# Patient Record
Sex: Female | Born: 1965 | Race: White | Hispanic: No | Smoking: Former smoker
Health system: Southern US, Community
[De-identification: ages and names within clinical notes are randomized; demographics above are authoritative.]

## PROBLEM LIST (undated history)

## (undated) DIAGNOSIS — Z9889 Other specified postprocedural states: Secondary | ICD-10-CM

## (undated) DIAGNOSIS — G43909 Migraine, unspecified, not intractable, without status migrainosus: Secondary | ICD-10-CM

## (undated) DIAGNOSIS — R112 Nausea with vomiting, unspecified: Secondary | ICD-10-CM

## (undated) DIAGNOSIS — F419 Anxiety disorder, unspecified: Secondary | ICD-10-CM

## (undated) HISTORY — PX: ABLATION: SHX5711

## (undated) HISTORY — DX: Migraine, unspecified, not intractable, without status migrainosus: G43.909

---

## 1998-03-20 ENCOUNTER — Other Ambulatory Visit: Admission: RE | Admit: 1998-03-20 | Discharge: 1998-03-20 | Payer: Self-pay | Admitting: *Deleted

## 1998-11-28 ENCOUNTER — Inpatient Hospital Stay (HOSPITAL_COMMUNITY): Admission: AD | Admit: 1998-11-28 | Discharge: 1998-11-28 | Payer: Self-pay | Admitting: Obstetrics and Gynecology

## 1998-12-27 ENCOUNTER — Inpatient Hospital Stay (HOSPITAL_COMMUNITY): Admission: AD | Admit: 1998-12-27 | Discharge: 1998-12-27 | Payer: Self-pay | Admitting: *Deleted

## 1999-01-07 ENCOUNTER — Inpatient Hospital Stay (HOSPITAL_COMMUNITY): Admission: AD | Admit: 1999-01-07 | Discharge: 1999-01-10 | Payer: Self-pay | Admitting: Obstetrics and Gynecology

## 1999-02-19 ENCOUNTER — Other Ambulatory Visit: Admission: RE | Admit: 1999-02-19 | Discharge: 1999-02-19 | Payer: Self-pay | Admitting: *Deleted

## 1999-08-28 ENCOUNTER — Other Ambulatory Visit: Admission: RE | Admit: 1999-08-28 | Discharge: 1999-08-28 | Payer: Self-pay | Admitting: *Deleted

## 1999-10-04 ENCOUNTER — Encounter (INDEPENDENT_AMBULATORY_CARE_PROVIDER_SITE_OTHER): Payer: Self-pay

## 1999-10-04 ENCOUNTER — Other Ambulatory Visit: Admission: RE | Admit: 1999-10-04 | Discharge: 1999-10-04 | Payer: Self-pay | Admitting: *Deleted

## 2000-02-28 ENCOUNTER — Other Ambulatory Visit: Admission: RE | Admit: 2000-02-28 | Discharge: 2000-02-28 | Payer: Self-pay | Admitting: Obstetrics & Gynecology

## 2000-08-24 ENCOUNTER — Inpatient Hospital Stay (HOSPITAL_COMMUNITY): Admission: AD | Admit: 2000-08-24 | Discharge: 2000-08-24 | Payer: Self-pay | Admitting: Obstetrics & Gynecology

## 2000-10-16 ENCOUNTER — Inpatient Hospital Stay (HOSPITAL_COMMUNITY): Admission: AD | Admit: 2000-10-16 | Discharge: 2000-10-19 | Payer: Self-pay | Admitting: Obstetrics & Gynecology

## 2001-07-01 ENCOUNTER — Encounter: Payer: Self-pay | Admitting: Family Medicine

## 2001-07-01 ENCOUNTER — Ambulatory Visit (HOSPITAL_COMMUNITY): Admission: RE | Admit: 2001-07-01 | Discharge: 2001-07-01 | Payer: Self-pay | Admitting: Family Medicine

## 2001-08-13 ENCOUNTER — Other Ambulatory Visit: Admission: RE | Admit: 2001-08-13 | Discharge: 2001-08-13 | Payer: Self-pay | Admitting: Obstetrics & Gynecology

## 2001-09-01 ENCOUNTER — Ambulatory Visit (HOSPITAL_COMMUNITY): Admission: RE | Admit: 2001-09-01 | Discharge: 2001-09-01 | Payer: Self-pay | Admitting: Family Medicine

## 2001-09-01 ENCOUNTER — Emergency Department (HOSPITAL_COMMUNITY): Admission: EM | Admit: 2001-09-01 | Discharge: 2001-09-01 | Payer: Self-pay | Admitting: Emergency Medicine

## 2001-09-01 ENCOUNTER — Encounter: Payer: Self-pay | Admitting: Family Medicine

## 2002-09-18 ENCOUNTER — Emergency Department (HOSPITAL_COMMUNITY): Admission: EM | Admit: 2002-09-18 | Discharge: 2002-09-18 | Payer: Self-pay | Admitting: Emergency Medicine

## 2003-01-05 ENCOUNTER — Other Ambulatory Visit: Admission: RE | Admit: 2003-01-05 | Discharge: 2003-01-05 | Payer: Self-pay | Admitting: Obstetrics & Gynecology

## 2004-02-20 ENCOUNTER — Other Ambulatory Visit: Admission: RE | Admit: 2004-02-20 | Discharge: 2004-02-20 | Payer: Self-pay | Admitting: Obstetrics and Gynecology

## 2005-05-13 ENCOUNTER — Other Ambulatory Visit: Admission: RE | Admit: 2005-05-13 | Discharge: 2005-05-13 | Payer: Self-pay | Admitting: Obstetrics and Gynecology

## 2005-10-27 ENCOUNTER — Ambulatory Visit (HOSPITAL_COMMUNITY): Admission: RE | Admit: 2005-10-27 | Discharge: 2005-10-27 | Payer: Self-pay | Admitting: Family Medicine

## 2009-05-07 ENCOUNTER — Emergency Department (HOSPITAL_COMMUNITY): Admission: EM | Admit: 2009-05-07 | Discharge: 2009-05-07 | Payer: Self-pay | Admitting: Emergency Medicine

## 2009-08-15 ENCOUNTER — Ambulatory Visit (HOSPITAL_COMMUNITY): Admission: RE | Admit: 2009-08-15 | Discharge: 2009-08-15 | Payer: Self-pay | Admitting: Obstetrics and Gynecology

## 2010-09-18 ENCOUNTER — Emergency Department (HOSPITAL_COMMUNITY)
Admission: EM | Admit: 2010-09-18 | Discharge: 2010-09-18 | Payer: Self-pay | Source: Home / Self Care | Admitting: Emergency Medicine

## 2010-09-26 ENCOUNTER — Ambulatory Visit (HOSPITAL_COMMUNITY)
Admission: RE | Admit: 2010-09-26 | Discharge: 2010-09-26 | Payer: Self-pay | Source: Home / Self Care | Attending: Orthopedic Surgery | Admitting: Orthopedic Surgery

## 2010-10-24 ENCOUNTER — Ambulatory Visit (HOSPITAL_COMMUNITY)
Admission: RE | Admit: 2010-10-24 | Discharge: 2010-10-24 | Payer: Self-pay | Source: Home / Self Care | Attending: Psychiatry | Admitting: Psychiatry

## 2010-11-07 ENCOUNTER — Encounter (HOSPITAL_COMMUNITY): Payer: Self-pay | Admitting: Psychiatry

## 2010-12-03 ENCOUNTER — Encounter (HOSPITAL_COMMUNITY): Payer: BC Managed Care – PPO | Admitting: Psychiatry

## 2010-12-09 LAB — URINALYSIS, ROUTINE W REFLEX MICROSCOPIC
Bilirubin Urine: NEGATIVE
Ketones, ur: NEGATIVE mg/dL
Nitrite: NEGATIVE
Urobilinogen, UA: 0.2 mg/dL (ref 0.0–1.0)

## 2010-12-09 LAB — URINE CULTURE: Culture: NO GROWTH

## 2011-01-01 LAB — CBC
MCV: 82.8 fL (ref 78.0–100.0)
Platelets: 298 10*3/uL (ref 150–400)
WBC: 6.7 10*3/uL (ref 4.0–10.5)

## 2011-01-01 LAB — PREGNANCY, URINE: Preg Test, Ur: NEGATIVE

## 2011-02-14 NOTE — Discharge Summary (Signed)
[facility]  Patient:    Sherri Salazar, Sherri Salazar                   MRN: [id number] Adm. Date:  [id number] Disc. Date: [id number] Attending:  Mickle Mallory Dictator:   Leilani Able, P.A.                           Discharge Summary  FINAL DIAGNOSES:              Intrauterine pregnancy at [redacted] weeks gestation, history of previous cesarean section, refuses vaginal birth after cesarean section, desires permanent sterilization.  PROCEDURE:                    Low transverse cesarean section and bilateral tubal ligation using the modified Pomeroy method.  SURGEON:                      Dr. Marcelle Overlie.  COMPLICATIONS:                None.                                This 45 year old G3, P1-0-1-1 presents at [redacted] weeks gestation for repeat cesarean section.  Patient had had a previous cesarean section performed in April 2000 and refuses vaginal birth after cesarean section with this pregnancy.  Patient also desires permanent sterilization.  Patient is taken to the operating room on October 16, 2000 by Dr. Marcelle Overlie where a repeat low transverse cesarean section is performed with the delivery of a 6 pound 10 ounce female infant with Apgars of 9 and 9.  Delivery went without complications.  At this point patient ______ for tubal ligation.  A bilateral tubal ligation using the modified Pomeroy method was performed.  The procedure went without complication.  Patients postoperative course was benign.  She was on some extra iron supplementation for a low hemoglobin and was felt ready for discharge on postoperative day #3. Was sent home on a regular diet, told to decrease activities.  Told to continue her iron.  Was given Darvocet-N 100 #20 one to two q.4h. as needed for pain.  Told to follow-up in the office in four weeks.  LABORATORIES:                 Hemoglobin 9.4, white blood cell count 10.9. DD:  11/11/00 TD:  11/11/00 Job:  [id number]

## 2011-02-14 NOTE — Op Note (Signed)
[facility]  Patient:    Sherri Salazar, Sherri Salazar                   MRN: [id number] Proc. Date: 10/16/00 Adm. Date:  [id number] Attending:  Mickle Mallory                           Operative Report  PREOPERATIVE DIAGNOSES:       1. Intrauterine pregnancy, 39 weeks.                               2. Previous cesarean section, refuses a vaginal                                  birth after cesarean section.                               3. Desires permanent sterilization.  POSTOPERATIVE DIAGNOSES:      1. Intrauterine pregnancy, 39 weeks.                               2. Previous cesarean section, refuses a vaginal                                  birth after cesarean section.                               3. Desires permanent sterilization.  PROCEDURE:                    1. Low transverse cesarean section.                               2. Bilateral tubal ligation, modified Pomeroy                                  method. SURGEON:                      Marcelle Overlie, M.D.  ANESTHESIA:                   Spinal.  ESTIMATED BLOOD LOSS:         500 cc.  FINDINGS:                     A female infant in cephalic presentation with Apgars 9 at one minute, 9 at five minutes, weighing 6 pounds 10 ounces.  COMPLICATIONS:                None.  PATHOLOGY:                    Bilateral fallopian tube segments.  DESCRIPTION OF PROCEDURE:     Patient was taken to the operating room. Her spinal was placed without incident. She was then placed in the dorsal supine position with a leftward tilt. The abdomen was then prepped and draped in the  usual sterile fashion and the Foley catheter was placed in the bladder. Using the scalpel, a low transverse incision was made in the area of the previous incision and the incision was extended down to the fascia using good hemostasis. The fascia was scored in the midline. The incision was extended laterally. A Pfannenstiel  incision was created and the rectus muscles were separated in the midline. The peritoneum was entered sharply and the peritoneal incision was then extended superiorly and then inferiorly. The bladder blade was inserted, the lower uterine segment was identified, the bladder flap was then created sharply and then digitally, and then the bladder blade was readjusted. Using the scalpel, a low transverse incision was made and then extended laterally. The amniotic fluid was noted to be clear upon entry into the uterine cavity. The baby was in cephalic presentation, was delivered without incident, with Apgars 9 at one minute, 9 at five minutes, weighing 6 pounds 10 ounces. The cord was clamped and cut and the baby was handed to the awaiting pediatricians and subsequently taken to the newborn nursery. Cord blood was obtained. The placenta was manually removed and noted to be normal and intact. The uterus was cleared of all debris and clots. The uterine incision was closed in a single layer using 0 chromic in continuous running lock stitch and was inspected and noted to be hemostatic. We then turned our attention to the fallopian tubes and the left fallopian tube was visualized and the fimbriated end was clearly identified, the midportion of the tube was grasped using a Babcock clamp, a 3 cm knuckle of tube was tied off using 0 plain gut suture x 2. The tubal segment was excised using Metzenbaum scissors and sent to pathology. Attention was then turned to the right side where the fallopian tube was visualized and the fimbriated end was clearly identified and the Babcock clamp was grasped into the midportion and a 3 cm knuckle was tied off using 0 plain gut suture x 2 and Metzenbaum scissors were used to excise the tubal segment. This tubal segment was also sent to pathology. The uterine incision was then reinspected and noted to be hemostatic. The peritoneum was closed in a continuous running stitch  using 0 Vicryl and the fascia was closed using 0 Vicryl in continuous running lock stitch x 2 starting at each corner and meeting in the midline. After inspection of the subcutaneous layer and noting hemostasis, the skin was closed with staples. All sponge, lap, and instrument counts were correct x 2. Patient tolerated the procedure well and went to recovery room in stable condition. DD:  10/16/00 TD:  10/17/00 Job: [id number]

## 2011-07-25 ENCOUNTER — Other Ambulatory Visit: Payer: Self-pay | Admitting: Obstetrics and Gynecology

## 2012-10-20 ENCOUNTER — Other Ambulatory Visit: Payer: Self-pay | Admitting: Obstetrics and Gynecology

## 2013-09-08 ENCOUNTER — Other Ambulatory Visit: Payer: Self-pay | Admitting: Adult Health

## 2013-09-08 DIAGNOSIS — Z139 Encounter for screening, unspecified: Secondary | ICD-10-CM

## 2013-09-20 ENCOUNTER — Encounter (INDEPENDENT_AMBULATORY_CARE_PROVIDER_SITE_OTHER): Payer: BC Managed Care – PPO | Admitting: Adult Health

## 2013-09-20 ENCOUNTER — Ambulatory Visit (HOSPITAL_COMMUNITY)
Admission: RE | Admit: 2013-09-20 | Discharge: 2013-09-20 | Disposition: A | Discharge status: Home / Self Care | Payer: BC Managed Care – PPO | Source: Ambulatory Visit | Attending: Adult Health | Admitting: Adult Health

## 2013-09-20 DIAGNOSIS — Z1231 Encounter for screening mammogram for malignant neoplasm of breast: Secondary | ICD-10-CM | POA: Insufficient documentation

## 2013-09-20 DIAGNOSIS — Z139 Encounter for screening, unspecified: Secondary | ICD-10-CM

## 2013-09-20 NOTE — Progress Notes (Signed)
This encounter was created in error - please disregard.

## 2013-10-25 ENCOUNTER — Other Ambulatory Visit: Payer: BC Managed Care – PPO | Admitting: Adult Health

## 2013-11-14 ENCOUNTER — Other Ambulatory Visit: Payer: BC Managed Care – PPO | Admitting: Adult Health

## 2013-11-24 ENCOUNTER — Other Ambulatory Visit: Payer: BC Managed Care – PPO | Admitting: Adult Health

## 2013-12-15 ENCOUNTER — Encounter (INDEPENDENT_AMBULATORY_CARE_PROVIDER_SITE_OTHER): Payer: Self-pay

## 2013-12-15 ENCOUNTER — Other Ambulatory Visit (HOSPITAL_COMMUNITY)
Admission: RE | Admit: 2013-12-15 | Discharge: 2013-12-15 | Disposition: A | Discharge status: Home / Self Care | Payer: BC Managed Care – PPO | Source: Ambulatory Visit | Attending: Adult Health | Admitting: Adult Health

## 2013-12-15 ENCOUNTER — Encounter: Payer: Self-pay | Admitting: Adult Health

## 2013-12-15 ENCOUNTER — Ambulatory Visit (INDEPENDENT_AMBULATORY_CARE_PROVIDER_SITE_OTHER): Payer: BC Managed Care – PPO | Admitting: Adult Health

## 2013-12-15 VITALS — BP 108/62 | HR 70 | Ht 63.0 in | Wt 147.0 lb

## 2013-12-15 DIAGNOSIS — Z01419 Encounter for gynecological examination (general) (routine) without abnormal findings: Secondary | ICD-10-CM | POA: Insufficient documentation

## 2013-12-15 DIAGNOSIS — Z1212 Encounter for screening for malignant neoplasm of rectum: Secondary | ICD-10-CM

## 2013-12-15 DIAGNOSIS — G43909 Migraine, unspecified, not intractable, without status migrainosus: Secondary | ICD-10-CM | POA: Insufficient documentation

## 2013-12-15 DIAGNOSIS — Z1151 Encounter for screening for human papillomavirus (HPV): Secondary | ICD-10-CM | POA: Insufficient documentation

## 2013-12-15 LAB — HEMOCCULT GUIAC POC 1CARD (OFFICE): Fecal Occult Blood, POC: NEGATIVE

## 2013-12-15 MED ORDER — ESTRADIOL 0.1 MG/24HR TD PTTW: MEDICATED_PATCH | TRANSDERMAL | Status: DC

## 2013-12-15 NOTE — Progress Notes (Signed)
Patient ID: Sherri Salazar, female   DOB: 03/12/1966, 48 y.o.   MRN: [id number] History of Present Illness: Sherri Salazar is a 48 year old white female, married in for a pap and physical and complains of headaches every month, tried Topamax, felt bad and stopped.Has had ablation but spots some.Headache calendar show pattern that looks like menstrual headaches.Has some nausea.Says Excedrin migraine helps.Was seen at [facility] in past.   Current Medications, Allergies, Past Medical History, Past Surgical History, Family History and Social History were reviewed in [employer] record.     Review of Systems: Patient denies any blurred vision, shortness of breath, chest pain, abdominal pain, problems with bowel movements, urination, or intercourse. No joint pain or mood swings. Had 2 C-sections.   Physical Exam:BP 108/62  Pulse 70  Ht 5\' 3"  (1.6 m)  Wt 147 lb (66.679 kg)  BMI 26.05 kg/m2 General:  Well developed, well nourished, no acute distress Skin:  Warm and dry Neck:  Midline trachea, normal thyroid Lungs; Clear to auscultation bilaterally Breast:  No dominant palpable mass, retraction, or nipple discharge Cardiovascular: Regular rate and rhythm Abdomen:  Soft, non tender, no hepatosplenomegaly Pelvic:  External genitalia is normal in appearance.  The vagina is normal in appearance. The cervix is smooth. Pap with HPV. Uterus is felt to be normal size, shape, and contour.  No  adnexal masses or tenderness noted. Rectal: Good sphincter tone, no polyps, or hemorrhoids felt.  Hemoccult negative. Extremities:  No swelling noted Psych:  No mood changes, alert and cooperative,seems happy teaches first grade Discussed with Dr Elonda Husky.  Impression: Yearly gyn exam Migraines ? menstrual     Plan: Try minivelle patch 0.1 mg 1 patch before headache starts 4 patches give, lot [id number] Exp 4/16,  Return in 4 weeks Physical in 1 year Mammogram yearly   Colonoscopy at 38

## 2013-12-15 NOTE — Patient Instructions (Signed)
Recurrent Migraine Headache A migraine headache is an intense, throbbing pain on one or both sides of your head. Recurrent migraines keep coming back. A migraine can last for 30 minutes to several hours. CAUSES  The exact cause of a migraine headache is not always known. However, a migraine may be caused when nerves in the brain become irritated and release chemicals that cause inflammation. This causes pain. Certain things may also trigger migraines, such as:   Alcohol.  Smoking.  Stress.  Menstruation.  Aged cheeses.  Foods or drinks that contain nitrates, glutamate, aspartame, or tyramine.  Lack of sleep.  Chocolate.  Caffeine.  Hunger.  Physical exertion.  Fatigue.  Medicines used to treat chest pain (nitroglycerine), birth control pills, estrogen, and some blood pressure medicines. SYMPTOMS   Pain on one or both sides of your head.  Pulsating or throbbing pain.  Severe pain that prevents daily activities.  Pain that is aggravated by any physical activity.  Nausea, vomiting, or both.  Dizziness.  Pain with exposure to bright lights, loud noises, or activity.  General sensitivity to bright lights, loud noises, or smells. Before you get a migraine, you may get warning signs that a migraine is coming (aura). An aura may include:  Seeing flashing lights.  Seeing bright spots, halos, or zig-zag lines.  Having tunnel vision or blurred vision.  Having feelings of numbness or tingling.  Having trouble talking.  Having muscle weakness. DIAGNOSIS  A recurrent migraine headache is often diagnosed based on:  Symptoms.  Physical examination.  A CT scan or MRI of your head. These imaging tests cannot diagnose migraines, but can help rule out other causes of headaches.  TREATMENT  Medicines may be given for pain and nausea. Medicines can also be given to help prevent recurrent migraines. HOME CARE INSTRUCTIONS  Only take over-the-counter or prescription  medicines for pain or discomfort as directed by your health care provider. The use of long-term narcotics is not recommended.  Lie down in a dark, quiet room when you have a migraine.  Keep a journal to find out what may trigger your migraine headaches. For example, write down:  What you eat and drink.  How much sleep you get.  Any change to your diet or medicines.  Limit alcohol consumption.  Quit smoking if you smoke.  Get 7 9 hours of sleep, or as recommended by your health care provider.  Limit stress.  Keep lights dim if bright lights bother you and make your migraines worse. SEEK MEDICAL CARE IF:   You do not get relief from the medicines given to you.  You have a recurrence of pain. SEEK IMMEDIATE MEDICAL CARE IF:  Your migraine becomes severe.  You have a fever.  You have a stiff neck.  You have loss of vision.  You have muscular weakness or loss of muscle control.  You start losing your balance or have trouble walking.  You feel faint or pass out.  You have severe symptoms that are different from your first symptoms. MAKE SURE YOU:   Understand these instructions.  Will watch your condition.  Will get help right away if you are not doing well or get worse. Document Released: 06/10/2001 Document Revised: 07/06/2013 Document Reviewed: 05/23/2013 [facility] Patient Information 2014 [place]. TRY Patch Follow up in 4 weeks Physical in 1 year Mammogram yearly  Colonoscopy at 89

## 2014-01-12 ENCOUNTER — Encounter: Payer: Self-pay | Admitting: Adult Health

## 2014-01-12 ENCOUNTER — Ambulatory Visit (INDEPENDENT_AMBULATORY_CARE_PROVIDER_SITE_OTHER): Payer: BC Managed Care – PPO | Admitting: Adult Health

## 2014-01-12 VITALS — BP 110/74 | Ht 63.0 in | Wt 149.0 lb

## 2014-01-12 DIAGNOSIS — G43909 Migraine, unspecified, not intractable, without status migrainosus: Secondary | ICD-10-CM

## 2014-01-12 MED ORDER — ESTRADIOL 0.1 MG/24HR TD PTTW: MEDICATED_PATCH | TRANSDERMAL | Status: DC

## 2014-01-12 MED ORDER — BUTALBITAL-APAP-CAFFEINE 50-325-40 MG PO TABS: 1.0000 | ORAL_TABLET | Freq: Four times a day (QID) | ORAL | Status: DC | PRN

## 2014-01-12 NOTE — Progress Notes (Signed)
Subjective:     Patient ID: Sherri Salazar, female   DOB: December 12, 1965, 48 y.o.   MRN: [id number]  HPI Sherri Salazar is a 35 yea old white female back to discuss migraines, may be a bit better, had one 3/23 and 4/10 and used patch, helped some but still lasted 10-12 hours.Has 1 today but has been stressed at school and is even teary today.and teen age daughter is being difficult.  Review of Systems See HPI Reviewed past medical,surgical, social and family history. Reviewed medications and allergies.     Objective:   Physical Exam BP 110/74  Ht 5\' 3"  (1.6 m)  Wt 149 lb (67.586 kg)  BMI 26.40 kg/m2   talked about taking time for self, continue patch and lets try Fioricet, also mentioned counseling. She says can't do now due to work.  Assessment:     Migraines     Plan:     Rx fioricet #30 1 every 6 hours prn headache with 1 refill Continue trying patch minivelle 0.1 mg [id number] exp 4/16 Call in follow up

## 2014-01-12 NOTE — Patient Instructions (Signed)
Call me in follow up

## 2014-02-23 ENCOUNTER — Telehealth: Payer: Self-pay | Admitting: Adult Health

## 2014-02-23 MED ORDER — ESTRADIOL 0.1 MG/24HR TD PTTW: MEDICATED_PATCH | TRANSDERMAL | Status: DC

## 2014-02-23 NOTE — Telephone Encounter (Signed)
Pt states MInivelle patch has helped with the migraines, requesting a prescription to be sent to pharmacy.

## 2014-02-23 NOTE — Telephone Encounter (Signed)
Pt says patch has helped headaches an she wants them called in to [facility]

## 2014-07-31 ENCOUNTER — Encounter: Payer: Self-pay | Admitting: Adult Health

## 2015-10-12 ENCOUNTER — Other Ambulatory Visit: Payer: Self-pay

## 2015-10-12 ENCOUNTER — Encounter: Payer: Self-pay | Admitting: Vascular Surgery

## 2015-10-12 DIAGNOSIS — I8390 Asymptomatic varicose veins of unspecified lower extremity: Secondary | ICD-10-CM

## 2015-10-15 ENCOUNTER — Encounter: Payer: Self-pay | Admitting: Vascular Surgery

## 2015-10-15 ENCOUNTER — Ambulatory Visit (HOSPITAL_COMMUNITY)
Admission: RE | Admit: 2015-10-15 | Discharge: 2015-10-15 | Disposition: A | Discharge status: Home / Self Care | Payer: BLUE CROSS/BLUE SHIELD | Source: Ambulatory Visit | Attending: Vascular Surgery | Admitting: Vascular Surgery

## 2015-10-15 ENCOUNTER — Ambulatory Visit (INDEPENDENT_AMBULATORY_CARE_PROVIDER_SITE_OTHER): Payer: BLUE CROSS/BLUE SHIELD | Admitting: Vascular Surgery

## 2015-10-15 VITALS — BP 116/79 | HR 62 | Temp 98.0°F | Resp 16 | Ht 63.0 in | Wt 138.0 lb

## 2015-10-15 DIAGNOSIS — I868 Varicose veins of other specified sites: Secondary | ICD-10-CM

## 2015-10-15 DIAGNOSIS — I83891 Varicose veins of right lower extremities with other complications: Secondary | ICD-10-CM | POA: Insufficient documentation

## 2015-10-15 DIAGNOSIS — I8390 Asymptomatic varicose veins of unspecified lower extremity: Secondary | ICD-10-CM

## 2015-10-15 DIAGNOSIS — M79604 Pain in right leg: Secondary | ICD-10-CM | POA: Diagnosis not present

## 2015-10-15 DIAGNOSIS — I8391 Asymptomatic varicose veins of right lower extremity: Secondary | ICD-10-CM | POA: Insufficient documentation

## 2015-10-15 NOTE — Progress Notes (Signed)
Subjective:     Patient ID: Sherri Salazar, female   DOB: [birth year], 50 y.o.   MRN: [id number]  HPI this 50 year old female is evaluated today for painful varicosities in the right leg. She is self-referred. She has had painful discomfort and a small varicosity in the right lower leg laterally just below the knee. This began after her first pregnancy. She also had a prominent vein extending upper thigh into the right inguinal area which has become less prominent since the pregnancy. She has no history of DVT or thrombophlebitis stasis ulcers or bleeding. She does not last compression stockings. She is on her feet most of the day.  Past Medical History  Diagnosis Date  . Migraines     Social History  Substance Use Topics  . Smoking status: Former Smoker    Types: Cigarettes  . Smokeless tobacco: Never Used  . Alcohol Use: No    Family History  Problem Relation Age of Onset  . Parkinson's disease Father     Allergies  Allergen Reactions  . Codeine Other (See Comments)    hallucinate     Current outpatient prescriptions:  .  Aspirin-Acetaminophen-Caffeine (EXCEDRIN MIGRAINE PO), Take by mouth as needed. Reported on 10/15/2015, Disp: , Rfl:  .  butalbital-acetaminophen-caffeine (FIORICET, ESGIC) 50-325-40 MG per tablet, Take 1 tablet by mouth every 6 (six) hours as needed for headache. (Patient not taking: Reported on 10/15/2015), Disp: 30 tablet, Rfl: 1 .  estradiol (MINIVELLE) 0.1 MG/24HR patch, Use as directed (Patient not taking: Reported on 10/15/2015), Disp: 8 patch, Rfl: 0 .  estradiol (MINIVELLE) 0.1 MG/24HR patch, Use 1 patch before period for migraine prevention (Patient not taking: Reported on 10/15/2015), Disp: 8 patch, Rfl: 1  Filed Vitals:   10/15/15 1017  BP: 116/79  Pulse: 62  Temp: 98 F (36.7 C)  Resp: 16  Height: 5\' 3"  (1.6 m)  Weight: 138 lb (62.596 kg)  SpO2: 98%    Body mass index is 24.45 kg/(m^2).           Review of Systems denies chest  pain, dyspnea on exertion, PND, orthopnea. Negative review of systems is healthy lady     Objective:   Physical Exam BP 116/79 mmHg  Pulse 62  Temp(Src) 98 F (36.7 C)  Resp 16  Ht 5\' 3"  (1.6 m)  Wt 138 lb (62.596 kg)  BMI 24.45 kg/m2  SpO2 98%  Gen.-alert and oriented x3 in no apparent distress HEENT normal for age Lungs no rhonchi or wheezing Cardiovascular regular rhythm no murmurs carotid pulses 3+ palpable no bruits audible Abdomen soft nontender no palpable masses Musculoskeletal free of  major deformities Skin clear -no rashes Neurologic normal Lower extremities 3+ femoral and dorsalis pedis pulses palpable bilaterally with no edema Right leg with 2 prominent reticular veins in the proximal third laterally below the knee. Also linear collection of reticular veins in the anterior thigh extending up to near inguinal area. Noted edema distally. No ulceration noted. No hyperpigmentation noted.  Today I ordered a venous duplex exam the right leg which I reviewed and interpreted. There is no DVT. There is no reflux in the great saphenous vein other than below the knee where there are a few perforating branches which are not competent. Small saphenous is competent.      Assessment:     Painful reticular and spider veins right anterior thigh and lateral calf with a few small incompetent perforating branches below the knee but no gross refluxing great saphenous  system    Plan:     Discussed foam sclerotherapy for these areas and patient will consider this

## 2016-04-25 ENCOUNTER — Other Ambulatory Visit: Payer: Self-pay | Admitting: Adult Health

## 2016-04-25 DIAGNOSIS — Z1231 Encounter for screening mammogram for malignant neoplasm of breast: Secondary | ICD-10-CM

## 2016-05-07 ENCOUNTER — Other Ambulatory Visit: Payer: Self-pay | Admitting: Adult Health

## 2016-05-09 ENCOUNTER — Ambulatory Visit (HOSPITAL_COMMUNITY)
Admission: RE | Admit: 2016-05-09 | Discharge: 2016-05-09 | Disposition: A | Discharge status: Home / Self Care | Payer: BLUE CROSS/BLUE SHIELD | Source: Ambulatory Visit | Attending: Adult Health | Admitting: Adult Health

## 2016-05-09 DIAGNOSIS — Z1231 Encounter for screening mammogram for malignant neoplasm of breast: Secondary | ICD-10-CM

## 2016-05-13 ENCOUNTER — Other Ambulatory Visit: Payer: Self-pay | Admitting: Adult Health

## 2016-07-10 ENCOUNTER — Encounter: Payer: Self-pay | Admitting: *Deleted

## 2016-07-16 ENCOUNTER — Ambulatory Visit: Payer: BLUE CROSS/BLUE SHIELD | Admitting: *Deleted

## 2016-07-16 ENCOUNTER — Ambulatory Visit (INDEPENDENT_AMBULATORY_CARE_PROVIDER_SITE_OTHER): Payer: BLUE CROSS/BLUE SHIELD | Admitting: *Deleted

## 2016-07-16 DIAGNOSIS — I8393 Asymptomatic varicose veins of bilateral lower extremities: Secondary | ICD-10-CM

## 2016-07-16 DIAGNOSIS — I83891 Varicose veins of right lower extremities with other complications: Secondary | ICD-10-CM

## 2016-07-16 NOTE — Progress Notes (Signed)
X=.3% Sotradecol administered with a 27g butterfly.  Patient received a total of 12cc.  Combo of retics and spiders. Easy access. Tol well. Anticipate good results for this nice lady. Follow prn.    Compression stockings applied: Yes.  and a 4" ace oer retics.

## 2017-06-20 ENCOUNTER — Other Ambulatory Visit (HOSPITAL_COMMUNITY): Payer: Self-pay | Admitting: Physician Assistant

## 2017-06-20 ENCOUNTER — Ambulatory Visit (HOSPITAL_COMMUNITY)
Admission: RE | Admit: 2017-06-20 | Discharge: 2017-06-20 | Disposition: A | Discharge status: Home / Self Care | Payer: BLUE CROSS/BLUE SHIELD | Source: Ambulatory Visit | Attending: Physician Assistant | Admitting: Physician Assistant

## 2017-06-20 DIAGNOSIS — R059 Cough, unspecified: Secondary | ICD-10-CM

## 2017-06-20 DIAGNOSIS — R0602 Shortness of breath: Secondary | ICD-10-CM

## 2017-06-20 DIAGNOSIS — R05 Cough: Secondary | ICD-10-CM

## 2017-12-17 ENCOUNTER — Other Ambulatory Visit: Payer: Self-pay | Admitting: Adult Health

## 2017-12-17 DIAGNOSIS — Z1231 Encounter for screening mammogram for malignant neoplasm of breast: Secondary | ICD-10-CM

## 2017-12-21 ENCOUNTER — Encounter (HOSPITAL_COMMUNITY): Payer: Self-pay

## 2017-12-21 ENCOUNTER — Ambulatory Visit (HOSPITAL_COMMUNITY)
Admission: RE | Admit: 2017-12-21 | Discharge: 2017-12-21 | Disposition: A | Discharge status: Home / Self Care | Payer: BLUE CROSS/BLUE SHIELD | Source: Ambulatory Visit | Attending: Adult Health | Admitting: Adult Health

## 2017-12-21 DIAGNOSIS — Z1231 Encounter for screening mammogram for malignant neoplasm of breast: Secondary | ICD-10-CM | POA: Insufficient documentation

## 2019-02-17 ENCOUNTER — Other Ambulatory Visit (HOSPITAL_COMMUNITY): Payer: Self-pay | Admitting: Adult Health

## 2019-02-17 DIAGNOSIS — Z1231 Encounter for screening mammogram for malignant neoplasm of breast: Secondary | ICD-10-CM

## 2019-02-23 ENCOUNTER — Encounter: Payer: Self-pay | Admitting: Emergency Medicine

## 2019-02-23 ENCOUNTER — Ambulatory Visit
Admission: EM | Admit: 2019-02-23 | Discharge: 2019-02-23 | Disposition: A | Discharge status: Home / Self Care | Payer: BLUE CROSS/BLUE SHIELD

## 2019-02-23 ENCOUNTER — Other Ambulatory Visit: Payer: Self-pay

## 2019-02-23 DIAGNOSIS — R1906 Epigastric swelling, mass or lump: Secondary | ICD-10-CM

## 2019-02-23 NOTE — ED Provider Notes (Addendum)
[facility]   [id number] 02/23/19 Arrival Time: 3151  CC: Abdominal mass  SUBJECTIVE:  Sherri Salazar is a 53 y.o. female no significant past medical hx, who presents with abdominal mass x 1 month.  Came in today because friend encouraged her to be examined.  Denies precipitating event or trauma.  Does admit to recent intentional weight loss secondary to weight-watchers. Localizes the mass to the epigastric area.  Describes as soft and moveable.  Denies pain.  Does not have PCP.  Denies fever, chills, night sweats, nausea, vomiting, SOB, chest pain, abdominal pain, changes in bowel or bladder function.    Denies family hx of cancer.    Reports mammogram in the past and normal  ROS: As per HPI.  Past Medical History:  Diagnosis Date  . Migraines    Past Surgical History:  Procedure Laterality Date  . ABLATION    . CESAREAN SECTION  [id number]   Allergies  Allergen Reactions  . Codeine Other (See Comments)    hallucinate   No current facility-administered medications on file prior to encounter.    Current Outpatient Medications on File Prior to Encounter  Medication Sig Dispense Refill  . Aspirin-Acetaminophen-Caffeine (EXCEDRIN MIGRAINE PO) Take by mouth as needed. Reported on 10/15/2015     Social History   Socioeconomic History  . Marital status: Married    Spouse name: Not on file  . Number of children: Not on file  . Years of education: Not on file  . Highest education level: Not on file  Occupational History  . Not on file  Social Needs  . Financial resource strain: Not on file  . Food insecurity:    Worry: Not on file    Inability: Not on file  . Transportation needs:    Medical: Not on file    Non-medical: Not on file  Tobacco Use  . Smoking status: Former Smoker    Types: Cigarettes  . Smokeless tobacco: Never Used  Substance and Sexual Activity  . Alcohol use: No  . Drug use: No  . Sexual activity: Yes    Birth control/protection:  Surgical  Lifestyle  . Physical activity:    Days per week: Not on file    Minutes per session: Not on file  . Stress: Not on file  Relationships  . Social connections:    Talks on phone: Not on file    Gets together: Not on file    Attends religious service: Not on file    Active member of club or organization: Not on file    Attends meetings of clubs or organizations: Not on file    Relationship status: Not on file  . Intimate partner violence:    Fear of current or ex partner: Not on file    Emotionally abused: Not on file    Physically abused: Not on file    Forced sexual activity: Not on file  Other Topics Concern  . Not on file  Social History Narrative  . Not on file   Family History  Problem Relation Age of Onset  . Parkinson's disease Father     OBJECTIVE: Vitals:   02/23/19 1216  BP: 117/74  Pulse: 75  Resp: 18  Temp: 97.8 F (36.6 C)  TempSrc: Oral  SpO2: 98%    General appearance: alert; healthy appearing Head: NCAT Lungs: clear to auscultation bilaterally Heart: regular rate and rhythm.  Radial pulse 2+ bilaterally Abdomen: soft, nondistended, normal active bowel sounds; nontender to  palpation; 2- 3 cm soft, freely moveable abdominal mass to epigastric region, NTTP, more prominent with abdominal muscle contraction; no guarding  Extremities: no edema Skin: warm and dry Psychological: alert and cooperative; anxious mood and affect  ASSESSMENT & PLAN:  1. Epigastric mass   DDx includes: hernia vs. Lipoma vs. Diastasis recti vs. Malignant mass  Recommending further evaluation and management of abdominal mass with PCP and/or general surgeon Please return or go to the ED if you experience pain, fever, chills, nausea, vomiting, night sweats, changes in bowel or bladder habits, etc...  Reviewed expectations re: course of current medical issues. Questions answered. Outlined signs and symptoms indicating need for more acute intervention. Patient  verbalized understanding. After Visit Summary given.      Lestine Box, PA-C 02/23/19 1244

## 2019-02-23 NOTE — ED Triage Notes (Signed)
Pt here for mass in epigastric area x 1 month; denies pain

## 2019-02-23 NOTE — Discharge Instructions (Signed)
Recommending further evaluation and management of abdominal mass with PCP and/or general surgeon Please return or go to the ED if you experience pain, fever, chills, nausea, vomiting, night sweats, changes in bowel or bladder habits, etc..Marland Kitchen

## 2019-03-14 ENCOUNTER — Other Ambulatory Visit: Payer: Self-pay

## 2019-03-14 ENCOUNTER — Ambulatory Visit (HOSPITAL_COMMUNITY)
Admission: RE | Admit: 2019-03-14 | Discharge: 2019-03-14 | Disposition: A | Discharge status: Home / Self Care | Payer: BC Managed Care – PPO | Source: Ambulatory Visit | Attending: Adult Health | Admitting: Adult Health

## 2019-03-14 DIAGNOSIS — Z1231 Encounter for screening mammogram for malignant neoplasm of breast: Secondary | ICD-10-CM | POA: Insufficient documentation

## 2019-09-28 ENCOUNTER — Ambulatory Visit: Payer: BC Managed Care – PPO | Attending: Internal Medicine

## 2019-09-28 ENCOUNTER — Other Ambulatory Visit: Payer: Self-pay

## 2019-09-28 DIAGNOSIS — Z20822 Contact with and (suspected) exposure to covid-19: Secondary | ICD-10-CM

## 2019-09-29 LAB — NOVEL CORONAVIRUS, NAA: SARS-CoV-2, NAA: NOT DETECTED

## 2019-11-09 ENCOUNTER — Encounter: Payer: Self-pay | Admitting: Adult Health

## 2019-11-09 ENCOUNTER — Other Ambulatory Visit: Payer: Self-pay

## 2019-11-09 ENCOUNTER — Ambulatory Visit (INDEPENDENT_AMBULATORY_CARE_PROVIDER_SITE_OTHER): Payer: BC Managed Care – PPO | Admitting: Adult Health

## 2019-11-09 ENCOUNTER — Other Ambulatory Visit (HOSPITAL_COMMUNITY)
Admission: RE | Admit: 2019-11-09 | Discharge: 2019-11-09 | Disposition: A | Discharge status: Home / Self Care | Payer: BC Managed Care – PPO | Source: Ambulatory Visit | Attending: Adult Health | Admitting: Adult Health

## 2019-11-09 VITALS — BP 115/66 | HR 80 | Ht 62.25 in | Wt 142.5 lb

## 2019-11-09 DIAGNOSIS — F32A Depression, unspecified: Secondary | ICD-10-CM | POA: Insufficient documentation

## 2019-11-09 DIAGNOSIS — Z01419 Encounter for gynecological examination (general) (routine) without abnormal findings: Secondary | ICD-10-CM | POA: Insufficient documentation

## 2019-11-09 DIAGNOSIS — Z1211 Encounter for screening for malignant neoplasm of colon: Secondary | ICD-10-CM | POA: Insufficient documentation

## 2019-11-09 DIAGNOSIS — K439 Ventral hernia without obstruction or gangrene: Secondary | ICD-10-CM

## 2019-11-09 DIAGNOSIS — Z1212 Encounter for screening for malignant neoplasm of rectum: Secondary | ICD-10-CM | POA: Diagnosis not present

## 2019-11-09 DIAGNOSIS — F329 Major depressive disorder, single episode, unspecified: Secondary | ICD-10-CM | POA: Diagnosis not present

## 2019-11-09 LAB — HEMOCCULT GUIAC POC 1CARD (OFFICE): Fecal Occult Blood, POC: NEGATIVE

## 2019-11-09 MED ORDER — PAROXETINE HCL 10 MG PO TABS: 10.0000 mg | ORAL_TABLET | Freq: Every day | ORAL | 2 refills | Status: DC

## 2019-11-09 NOTE — Progress Notes (Signed)
Patient ID: Sherri Salazar, female   DOB: [birth year], 54 y.o.   MRN: [id number] History of Present Illness: Star is a 54 year old white female,separated, G2P 2 in for a well woman gyn exam and pap.Last pap was 12/15/13 and normal with negative HPV.   Current Medications, Allergies, Past Medical History, Past Surgical History, Family History and Social History were reviewed in [employer] record.     Review of Systems: Patient denies any headaches, hearing loss, fatigue, blurred vision, shortness of breath, chest pain, abdominal pain, problems with bowel movements, urination, or intercourse(no sex since 2012). No joint pain, Denies any hot flashes or night sweats, is teary and less focused and feels worthless at times esp since October when she separated. She did [employer] about 2 years ago and lost weight and now has bulge in abdomen,she says if she strains it really bulges out more.   Physical Exam:BP 115/66 (BP Location: Left Arm, Patient Position: Sitting, Cuff Size: Normal)   Pulse 80   Ht 5' 2.25" (1.581 m)   Wt 142 lb 8 oz (64.6 kg)   BMI 25.85 kg/m  General:  Well developed, well nourished, no acute distress Skin:  Warm and dry Neck:  Midline trachea, normal thyroid, good ROM, no lymphadenopathy Lungs; Clear to auscultation bilaterally Breast:  No dominant palpable mass, retraction, or nipple discharge Cardiovascular: Regular rate and rhythm Abdomen:  Soft, non tender, no hepatosplenomegaly, has golf ball sized ?hernia mid epigastric area, on tender and reducible  Pelvic:  External genitalia is normal in appearance, no lesions.  The vagina is pale with loss of moisture and rugae. Urethra has no lesions or masses. The cervix is smooth ,pap with high risk HPV performed.  Uterus is felt to be normal size, shape, and contour.  No adnexal masses or tenderness noted.Bladder is non tender, no masses felt. Rectal: Good sphincter tone, no polyps, or hemorrhoids felt.   Hemoccult negative. Extremities/musculoskeletal:  No swelling or varicosities noted, no clubbing or cyanosis Psych:  No mood changes, alert and cooperative,seems happy Fall risk is low PHQ 9 score is 10, denies being suicidal, and she is open to meds Examination chaperoned by Levy Pupa LPN  Impression and Plan: 1. Encounter for gynecological examination with Papanicolaou smear of cervix Pap sent Physical in 1 year Pap in 3 if normal Check CBC,CMP,TSH and lipids, orders given to get fasting  Mammogram yearly  2. Screening for colorectal cancer Consider cologuard or colonoscopy, will let me know  3. Depression, unspecified depression type Will rx paxil Meds ordered this encounter  Medications  . PARoxetine (PAXIL) 10 MG tablet    Sig: Take 1 tablet (10 mg total) by mouth daily.    Dispense:  30 tablet    Refill:  2    Order Specific Question:   Supervising Provider    Answer:   Tania Ade H [[id number]]  Follow up in 6 weeks   4. Ventral hernia without obstruction or gangrene  Referred to Dr Constance Haw to evaluate  If any pain and can not reduce, go to ER

## 2019-11-11 LAB — CYTOLOGY - PAP
Comment: NEGATIVE
Diagnosis: NEGATIVE
High risk HPV: NEGATIVE

## 2019-11-17 LAB — COMPREHENSIVE METABOLIC PANEL
ALT: 8 IU/L (ref 0–32)
AST: 20 IU/L (ref 0–40)
Albumin/Globulin Ratio: 1.7 (ref 1.2–2.2)
Albumin: 4.3 g/dL (ref 3.8–4.9)
Alkaline Phosphatase: 48 IU/L (ref 39–117)
BUN/Creatinine Ratio: 23 (ref 9–23)
BUN: 23 mg/dL (ref 6–24)
Bilirubin Total: <0.2 mg/dL (ref 0.0–1.2)
CO2: 24 mmol/L (ref 20–29)
Calcium: 9.3 mg/dL (ref 8.7–10.2)
Chloride: 104 mmol/L (ref 96–106)
Creatinine, Ser: 0.98 mg/dL (ref 0.57–1.00)
GFR calc Af Amer: 77 mL/min/{1.73_m2} (ref >59)
GFR calc non Af Amer: 67 mL/min/{1.73_m2} (ref >59)
Globulin, Total: 2.5 g/dL (ref 1.5–4.5)
Glucose: 82 mg/dL (ref 65–99)
Potassium: 4.2 mmol/L (ref 3.5–5.2)
Sodium: 140 mmol/L (ref 134–144)
Total Protein: 6.8 g/dL (ref 6.0–8.5)

## 2019-11-17 LAB — CBC
Hematocrit: 36.6 % (ref 34.0–46.6)
Hemoglobin: 12.2 g/dL (ref 11.1–15.9)
MCH: 29.2 pg (ref 26.6–33.0)
MCHC: 33.3 g/dL (ref 31.5–35.7)
MCV: 88 fL (ref 79–97)
Platelets: 283 10*3/uL (ref 150–450)
RBC: 4.18 x10E6/uL (ref 3.77–5.28)
RDW: 12.6 % (ref 11.7–15.4)
WBC: 5.5 10*3/uL (ref 3.4–10.8)

## 2019-11-17 LAB — LIPID PANEL
Chol/HDL Ratio: 2.1 ratio (ref 0.0–4.4)
Cholesterol, Total: 146 mg/dL (ref 100–199)
HDL: 71 mg/dL (ref >39)
LDL Chol Calc (NIH): 62 mg/dL (ref 0–99)
Triglycerides: 62 mg/dL (ref 0–149)
VLDL Cholesterol Cal: 13 mg/dL (ref 5–40)

## 2019-11-17 LAB — TSH: TSH: 3.02 u[IU]/mL (ref 0.450–4.500)

## 2019-11-24 ENCOUNTER — Ambulatory Visit: Payer: BC Managed Care – PPO | Admitting: General Surgery

## 2019-11-25 ENCOUNTER — Other Ambulatory Visit: Payer: BC Managed Care – PPO

## 2019-11-28 ENCOUNTER — Other Ambulatory Visit: Payer: Self-pay

## 2019-11-28 ENCOUNTER — Ambulatory Visit: Payer: BC Managed Care – PPO | Attending: Internal Medicine

## 2019-11-28 DIAGNOSIS — Z20822 Contact with and (suspected) exposure to covid-19: Secondary | ICD-10-CM

## 2019-11-30 ENCOUNTER — Telehealth: Payer: Self-pay

## 2019-11-30 LAB — NOVEL CORONAVIRUS, NAA: SARS-CoV-2, NAA: NOT DETECTED

## 2019-11-30 NOTE — Telephone Encounter (Signed)
Patient called and she was told her COVID-19 test 11/28/19 was still active and had not resulted. She verbalized understanding and will call later.

## 2019-12-08 ENCOUNTER — Encounter: Payer: Self-pay | Admitting: General Surgery

## 2019-12-08 ENCOUNTER — Ambulatory Visit: Payer: BC Managed Care – PPO | Admitting: General Surgery

## 2019-12-08 ENCOUNTER — Other Ambulatory Visit: Payer: Self-pay

## 2019-12-08 VITALS — BP 113/73 | HR 79 | Temp 98.1°F | Resp 12 | Ht 63.0 in | Wt 147.0 lb

## 2019-12-08 DIAGNOSIS — K439 Ventral hernia without obstruction or gangrene: Secondary | ICD-10-CM

## 2019-12-08 NOTE — Patient Instructions (Signed)
Ventral Hernia  A ventral hernia is a bulge of tissue from inside the abdomen that pushes through a weak area of the muscles that form the front wall of the abdomen. The tissues inside the abdomen are inside a sac (peritoneum). These tissues include the small intestine, large intestine, and the fatty tissue that covers the intestines (omentum). Sometimes, the bulge that forms a hernia contains intestines. Other hernias contain only fat. Ventral hernias do not go away without surgical treatment. There are several types of ventral hernias. You may have:  A hernia at an incision site from previous abdominal surgery (incisional hernia).  A hernia just above the belly button (epigastric hernia), or at the belly button (umbilical hernia). These types of hernias can develop from heavy lifting or straining.  A hernia that comes and goes (reducible hernia). It may be visible only when you lift or strain. This type of hernia can be pushed back into the abdomen (reduced).  A hernia that traps abdominal tissue inside the hernia (incarcerated hernia). This type of hernia does not reduce.  A hernia that cuts off blood flow to the tissues inside the hernia (strangulated hernia). The tissues can start to die if this happens. This is a very painful bulge that cannot be reduced. A strangulated hernia is a medical emergency. What are the causes? This condition is caused by abdominal tissue putting pressure on an area of weakness in the abdominal muscles. What increases the risk? The following factors may make you more likely to develop this condition:  Being female.  Being 60 or older.  Being overweight or obese.  Having had previous abdominal surgery, especially if there was an infection after surgery.  Having had an injury to the abdominal wall.  Having had several pregnancies.  Having a buildup of fluid inside the abdomen (ascites). What are the signs or symptoms? The only symptom of a ventral hernia  may be a painless bulge in the abdomen. A reducible hernia may be visible only when you strain, cough, or lift. Other symptoms may include:  Dull pain.  A feeling of pressure. Signs and symptoms of a strangulated hernia may include:  Increasing pain.  Nausea and vomiting.  Pain when pressing on the hernia.  The skin over the hernia turning red or purple.  Constipation.  Blood in the stool (feces). How is this diagnosed? This condition may be diagnosed based on:  Your symptoms.  Your medical history.  A physical exam. You may be asked to cough or strain while standing. These actions increase the pressure inside your abdomen and force the hernia through the opening in your muscles. Your health care provider may try to reduce the hernia by pressing on it.  Imaging studies, such as an ultrasound or CT scan. How is this treated? This condition is treated with surgery. If you have a strangulated hernia, surgery is done as soon as possible. If your hernia is small and not incarcerated, you may be asked to lose some weight before surgery. Follow these instructions at home:  Follow instructions from your health care provider about eating or drinking restrictions.  If you are overweight, your health care provider may recommend that you increase your activity level and eat a healthier diet.  Do not lift anything that is heavier than 10 lb (4.5 kg).  Return to your normal activities as told by your health care provider. Ask your health care provider what activities are safe for you. You may need to avoid activities   that increase pressure on your hernia.  Take over-the-counter and prescription medicines only as told by your health care provider.  Keep all follow-up visits as told by your health care provider. This is important. Contact a health care provider if:  Your hernia gets larger.  Your hernia becomes painful. Get help right away if:  Your hernia becomes increasingly  painful.  You have pain along with any of the following: ? Changes in skin color in the area of the hernia. ? Nausea. ? Vomiting. ? Fever. Summary  A ventral hernia is a bulge of tissue from inside the abdomen that pushes through a weak area of the muscles that form the front wall of the abdomen.  This condition is treated with surgery, which may be urgent depending on your hernia.  Do not lift anything that is heavier than 10 lb (4.5 kg), and follow activity instructions from your health care provider. This information is not intended to replace advice given to you by your health care provider. Make sure you discuss any questions you have with your health care provider. Document Revised: 10/28/2017 Document Reviewed: 04/06/2017 Elsevier Patient Education  2020 Elsevier Inc.   Open Hernia Repair, Adult  Open hernia repair is a surgical procedure to fix a hernia. A hernia occurs when an internal organ or tissue pushes out through a weak spot in the abdominal wall muscles. Hernias commonly occur in the groin and around the navel. Most hernias tend to get worse over time. Often, surgery is done to prevent the hernia from becoming bigger, uncomfortable, or an emergency. Emergency surgery may be needed if abdominal contents get stuck in the opening (incarcerated hernia) or the blood supply gets cut off (strangulated hernia). In an open repair, an incision is made in the abdomen to perform the surgery. Tell a health care provider about:  Any allergies you have.  All medicines you are taking, including vitamins, herbs, eye drops, creams, and over-the-counter medicines.  Any problems you or family members have had with anesthetic medicines.  Any blood or bone disorders you have.  Any surgeries you have had.  Any medical conditions you have, including any recent cold or flu symptoms.  Whether you are pregnant or may be pregnant. What are the risks? Generally, this is a safe procedure.  However, problems may occur, including:  Long-lasting (chronic) pain.  Bleeding.  Infection.  Damage to the testicle. This can cause shrinking or swelling.  Damage to the bladder, blood vessels, intestine, or nerves near the hernia.  Trouble passing urine.  Allergic reactions to medicines.  Return of the hernia. Medicines  Ask your health care provider about: ? Changing or stopping your regular medicines. This is especially important if you are taking diabetes medicines or blood thinners. ? Taking medicines such as aspirin and ibuprofen. These medicines can thin your blood. Do not take these medicines before your procedure if your health care provider instructs you not to.  You may be given antibiotic medicine to help prevent infection. General instructions  You may have blood tests or imaging studies.  Ask your health care provider how your surgical site will be marked or identified.  If you smoke, do not smoke for at least 2 weeks before your procedure or for as long as told by your health care provider.  Let your health care provider know if you develop a cold or any infection before your surgery.  Plan to have someone take you home from the hospital or clinic.    If you will be going home right after the procedure, plan to have someone with you for 24 hours. What happens during the procedure?  To reduce your risk of infection: ? Your health care team will wash or sanitize their hands. ? Your skin will be washed with soap. ? Hair may be removed from the surgical area.  An IV tube will be inserted into one of your veins.  You will be given one or more of the following: ? A medicine to help you relax (sedative). ? A medicine to numb the area (local anesthetic). ? A medicine to make you fall asleep (general anesthetic).  Your surgeon will make an incision over the hernia.  The tissues of the hernia will be moved back into place.  The edges of the hernia may be  stitched together.  The opening in the abdominal muscles will be closed with stitches (sutures). Or, your surgeon will place a mesh patch made of manmade (synthetic) material over the opening.  The incision will be closed.  A bandage (dressing) may be placed over the incision. The procedure may vary among health care providers and hospitals. What happens after the procedure?  Your blood pressure, heart rate, breathing rate, and blood oxygen level will be monitored until the medicines you were given have worn off.  You may be given medicine for pain.  Do not drive for 24 hours if you received a sedative. This information is not intended to replace advice given to you by your health care provider. Make sure you discuss any questions you have with your health care provider. Document Revised: 08/28/2017 Document Reviewed: 02/27/2016 Elsevier Patient Education  2020 Elsevier Inc.  

## 2019-12-08 NOTE — Progress Notes (Signed)
[facility] History and Physical  Reason for Referral: Ventral Hernia  Referring Physician:  Derrek Monaco NP   Chief Complaint    New Patient (Initial Visit)      Sherri Salazar is a 54 y.o. female.  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 She has started to notice a bulge in her upper abdominal wall after crunches for about 1 year. She says that this area can be pressed back in and it can be tender at times. She says that it has been getting larger.  She denies any changes in bowel function, nausea or vomiting. If she has not been touching it then it is not really sore.   Past Medical History:  Diagnosis Date  . Migraines     Past Surgical History:  Procedure Laterality Date  . ABLATION    . CESAREAN SECTION  2000,2002    Family History  Problem Relation Age of Onset  . Parkinson's disease Father     Social History   Tobacco Use  . Smoking status: Former Smoker    Types: Cigarettes    Quit date: 2018    Years since quitting: 3.1  . Smokeless tobacco: Never Used  Substance Use Topics  . Alcohol use: No  . Drug use: No    Medications: I have reviewed the patient's current medications. Allergies as of 12/08/2019      Reactions   Vicodin [hydrocodone-acetaminophen] Other (See Comments)   hallucinate      Medication List       Accurate as of December 08, 2019  3:56 PM. If you have any questions, ask your nurse or doctor.        EXCEDRIN MIGRAINE PO Take by mouth as needed. Reported on 10/15/2015   PARoxetine 10 MG tablet Commonly known as: Paxil Take 1 tablet (10 mg total) by mouth daily.        ROS:  A comprehensive review of systems was negative except for: Cardiovascular: positive for Varicose veins Gastrointestinal: positive for abdominal bulge, epigastric region, tender Musculoskeletal: positive for back pain  and joint pain Neurological: positive for numbness in right index finger on occasion Endocrine: positive for tired and sluggish  Blood pressure 113/73, pulse 79, temperature 98.1 F (36.7 C), temperature source Oral, resp. rate 12, height 5\' 3"  (1.6 m), weight 147 lb (66.7 kg), SpO2 99 %. Physical Exam Vitals reviewed.  Constitutional:      Appearance: Normal appearance.  HENT:     Head: Normocephalic and atraumatic.     Nose: Nose normal.     Mouth/Throat:     Mouth: Mucous membranes are moist.  Eyes:     Extraocular Movements: Extraocular movements intact.     Pupils: Pupils are equal, round, and reactive to light.  Cardiovascular:     Rate and Rhythm: Normal rate and regular rhythm.  Pulmonary:     Effort: Pulmonary effort is normal.     Breath sounds: Normal breath sounds.  Abdominal:     General: There is no distension.     Palpations: Abdomen is soft.     Tenderness: There is abdominal tenderness.     Hernia: A hernia is present.     Comments: Epigastric hernia with perperitoneal fat, tender, reduces, unable to feel fascial defect   Musculoskeletal:        General: No  swelling. Normal range of motion.     Cervical back: Normal range of motion. No rigidity.  Skin:    General: Skin is warm and dry.  Neurological:     General: No focal deficit present.     Mental Status: She is alert and oriented to person, place, and time.  Psychiatric:        Mood and Affect: Mood normal.        Behavior: Behavior normal.        Thought Content: Thought content normal.        Judgment: Judgment normal.     Results: None   Assessment & Plan:  Sherri Salazar is a 54 y.o. female with epigastric hernia that partially reduces with perperitoneal fat.    -OR for ventral hernia repair with possible mesh  -Discussed risk of bleeding, infection, injury to other organs, recurrence  -Discussed COVID testing -Discussed need for no heavy lifting > 10 lbs, no excessive bending,  squatting, pushing, pulling in the 8 weeks after surgery   All questions were answered to the satisfaction of the patient.    Sherri Salazar 12/08/2019, 3:56 PM

## 2019-12-14 ENCOUNTER — Encounter: Payer: Self-pay | Admitting: General Surgery

## 2019-12-14 NOTE — H&P (Signed)
[facility] History and Physical  Reason for Referral: Ventral Hernia  Referring Physician: Derrek Monaco NP     Chief Complaint     New Patient (Initial Visit)      Sherri Salazar is a 54 y.o. female.  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 She has started to notice a bulge in her upper abdominal wall after crunches for about 1 year. She says that this area can be pressed back in and it can be tender at times. She says that it has been getting larger. She denies any changes in bowel function, nausea or vomiting. If she has not been touching it then it is not really sore.      Past Medical History:  Diagnosis Date  . Migraines         Past Surgical History:  Procedure Laterality Date  . ABLATION    . CESAREAN SECTION  2000,2002        Family History  Problem Relation Age of Onset  . Parkinson's disease Father    Social History        Tobacco Use  . Smoking status: Former Smoker    Types: Cigarettes    Quit date: 2018    Years since quitting: 3.1  . Smokeless tobacco: Never Used  Substance Use Topics  . Alcohol use: No  . Drug use: No   Medications: I have reviewed the patient's current medications.       Allergies as of 12/08/2019      Reactions   Vicodin [hydrocodone-acetaminophen] Other (See Comments)   hallucinate           Medication List       Accurate as of December 08, 2019 3:56 PM. If you have any questions, ask your nurse or doctor.        EXCEDRIN MIGRAINE PO  Take by mouth as needed. Reported on 10/15/2015   PARoxetine 10 MG tablet  Commonly known as: Paxil  Take 1 tablet (10 mg total) by mouth daily.       ROS:  A comprehensive review of systems was negative except for: Cardiovascular: positive for Varicose veins  Gastrointestinal: positive for abdominal bulge, epigastric region, tender    Musculoskeletal: positive for back pain and joint pain  Neurological: positive for numbness in right index finger on occasion  Endocrine: positive for tired and sluggish  Blood pressure 113/73, pulse 79, temperature 98.1 F (36.7 C), temperature source Oral, resp. rate 12, height 5\' 3"  (1.6 m), weight 147 lb (66.7 kg), SpO2 99 %.  Physical Exam  Vitals reviewed.  Constitutional:  Appearance: Normal appearance.  HENT:  Head: Normocephalic and atraumatic.  Nose: Nose normal.  Mouth/Throat:  Mouth: Mucous membranes are moist.  Eyes:  Extraocular Movements: Extraocular movements intact.  Pupils: Pupils are equal, round, and reactive to light.  Cardiovascular:  Rate and Rhythm: Normal rate and regular rhythm.  Pulmonary:  Effort: Pulmonary effort is normal.  Breath sounds: Normal breath sounds.  Abdominal:  General: There is no distension.  Palpations: Abdomen is soft.  Tenderness: There is abdominal tenderness.  Hernia: A hernia is present.  Comments: Epigastric hernia with perperitoneal fat, tender, reduces, unable to feel fascial defect  Musculoskeletal:  General: No swelling. Normal range of motion.  Cervical back: Normal range of motion. No rigidity.  Skin:  General: Skin is  warm and dry.  Neurological:  General: No focal deficit present.  Mental Status: She is alert and oriented to person, place, and time.  Psychiatric:  Mood and Affect: Mood normal.  Behavior: Behavior normal.  Thought Content: Thought content normal.  Judgment: Judgment normal.   Results:  None  Assessment & Plan:  Sherri Salazar is a 54 y.o. female with epigastric hernia that partially reduces with perperitoneal fat.  -OR for ventral hernia repair with possible mesh  -Discussed risk of bleeding, infection, injury to other organs, recurrence  -Discussed COVID testing  -Discussed need for no heavy lifting > 10 lbs, no excessive bending, squatting, pushing, pulling in the 8 weeks after surgery   All questions were answered to the satisfaction of the patient.  Sherri Salazar  12/08/2019, 3:56 PM

## 2019-12-21 ENCOUNTER — Ambulatory Visit: Payer: BC Managed Care – PPO | Admitting: Adult Health

## 2019-12-28 ENCOUNTER — Other Ambulatory Visit (HOSPITAL_COMMUNITY): Payer: BC Managed Care – PPO

## 2019-12-28 ENCOUNTER — Ambulatory Visit: Payer: BC Managed Care – PPO | Admitting: Adult Health

## 2019-12-30 ENCOUNTER — Other Ambulatory Visit (HOSPITAL_COMMUNITY): Payer: BC Managed Care – PPO

## 2020-01-02 ENCOUNTER — Ambulatory Visit: Admit: 2020-01-02 | Payer: BC Managed Care – PPO | Admitting: General Surgery

## 2020-01-02 SURGERY — REPAIR, HERNIA, VENTRAL
Anesthesia: General

## 2020-01-03 ENCOUNTER — Ambulatory Visit: Payer: BC Managed Care – PPO | Admitting: Adult Health

## 2020-01-03 ENCOUNTER — Encounter: Payer: Self-pay | Admitting: Adult Health

## 2020-01-03 ENCOUNTER — Other Ambulatory Visit: Payer: Self-pay

## 2020-01-03 VITALS — BP 114/76 | HR 73 | Ht 62.25 in | Wt 143.0 lb

## 2020-01-03 DIAGNOSIS — F32A Depression, unspecified: Secondary | ICD-10-CM

## 2020-01-03 DIAGNOSIS — F329 Major depressive disorder, single episode, unspecified: Secondary | ICD-10-CM

## 2020-01-03 NOTE — Progress Notes (Signed)
  Subjective:     Patient ID: HEAVYNN VANDERHYDE, female   DOB: 02/16/1966, 54 y.o.   MRN: [id number]  HPI Onesty is a 54 year old white female, separated, G2P2 back in follow up on starting paxil 11/09/19 and she feels a little better but is not he self, esp feels down at night and when alone.She says she walks a lot.She has seen Dr Constance Haw and is having hernia fixed after school is out. PCP is Dr Nevada Crane.   Review of Systems Feels down Reviewed past medical,surgical, social and family history. Reviewed medications and allergies.     Objective:   Physical Exam BP 114/76 (BP Location: Right Arm, Patient Position: Sitting, Cuff Size: Normal)   Pulse 73   Ht 5' 2.25" (1.581 m)   Wt 143 lb (64.9 kg)   BMI 25.95 kg/m  Skin warm and dry. Lungs: clear to ausculation bilaterally. Cardiovascular: regular rate and rhythm. PHQ 9 score is 8, no SI, was 10 11/09/19.     Assessment:    1. Depression, unspecified depression type Increase Paxil to 20 mg just take 2 10 mg and call me in 2 weeks Will refer to [facility]:     Will talk in 2 weeks, then determine follow up appt.

## 2020-01-24 ENCOUNTER — Telehealth: Payer: Self-pay | Admitting: *Deleted

## 2020-01-24 NOTE — Telephone Encounter (Signed)
[facility] attempted to contact patient x2 but patient was not scheduled due to not answering call.

## 2020-03-05 NOTE — Patient Instructions (Signed)
Sherri Salazar  03/05/2020     @PREFPERIOPPHARMACY @   Your procedure is scheduled on  03/09/2020 .  Report to [facility] at  [phone]  A.M.  Call this number if you have problems the morning of surgery:  [phone]   Remember:  Do not eat or drink after midnight.                       Take these medicines the morning of surgery with A SIP OF WATER  None    Do not wear jewelry, make-up or nail polish.  Do not wear lotions, powders, or perfume. Please wear deodorant and brush your teeth.  Do not shave 48 hours prior to surgery.  Men may shave face and neck.  Do not bring valuables to the hospital.  [facility] is not responsible for any belongings or valuables.  Contacts, dentures or bridgework may not be worn into surgery.  Leave your suitcase in the car.  After surgery it may be brought to your room.  For patients admitted to the hospital, discharge time will be determined by your treatment team.  Patients discharged the day of surgery will not be allowed to drive home.   Name and phone number of your driver:   family Special instructions:  DO NOT smoke the morning of your procedure.  Please read over the following fact sheets that you were given. Anesthesia Post-op Instructions and Care and Recovery After Surgery       Open Hernia Repair, Adult, Care After These instructions give you information about caring for yourself after your procedure. Your doctor may also give you more specific instructions. If you have problems or questions, contact your doctor. Follow these instructions at home: Surgical cut (incision) care   Follow instructions from your doctor about how to take care of your surgical cut area. Make sure you: ? Wash your hands with soap and water before you change your bandage (dressing). If you cannot use soap and water, use hand sanitizer. ? Change your bandage as told by your doctor. ? Leave stitches (sutures), skin glue, or skin tape  (adhesive) strips in place. They may need to stay in place for 2 weeks or longer. If tape strips get loose and curl up, you may trim the loose edges. Do not remove tape strips completely unless your doctor says it is okay.  Check your surgical cut every day for signs of infection. Check for: ? More redness, swelling, or pain. ? More fluid or blood. ? Warmth. ? Pus or a bad smell. Activity  Do not drive or use heavy machinery while taking prescription pain medicine. Do not drive until your doctor says it is okay.  Until your doctor says it is okay: ? Do not lift anything that is heavier than 10 lb (4.5 kg). ? Do not play contact sports.  Return to your normal activities as told by your doctor. Ask your doctor what activities are safe. General instructions  To prevent or treat having a hard time pooping (constipation) while you are taking prescription pain medicine, your doctor may recommend that you: ? Drink enough fluid to keep your pee (urine) clear or pale yellow. ? Take over-the-counter or prescription medicines. ? Eat foods that are high in fiber, such as fresh fruits and vegetables, whole grains, and beans. ? Limit foods that are high in fat and processed sugars, such as fried and sweet foods.  Take over-the-counter and prescription medicines only as told by your doctor.  Do not take baths, swim, or use a hot tub until your doctor says it is okay.  Keep all follow-up visits as told by your doctor. This is important. Contact a doctor if:  You develop a rash.  You have more redness, swelling, or pain around your surgical cut.  You have more fluid or blood coming from your surgical cut.  Your surgical cut feels warm to the touch.  You have pus or a bad smell coming from your surgical cut.  You have a fever or chills.  You have blood in your poop (stool).  You have not pooped in 2-3 days.  Medicine does not help your pain. Get help right away if:  You have chest  pain or you are short of breath.  You feel light-headed.  You feel weak and dizzy (feel faint).  You have very bad pain.  You throw up (vomit) and your pain is worse. This information is not intended to replace advice given to you by your health care provider. Make sure you discuss any questions you have with your health care provider. Document Revised: 01/07/2019 Document Reviewed: 02/27/2016 Elsevier Patient Education  2020 [place] Anesthesia, Adult, Care After This sheet gives you information about how to care for yourself after your procedure. Your health care provider may also give you more specific instructions. If you have problems or questions, contact your health care provider. What can I expect after the procedure? After the procedure, the following side effects are common:  Pain or discomfort at the IV site.  Nausea.  Vomiting.  Sore throat.  Trouble concentrating.  Feeling cold or chills.  Weak or tired.  Sleepiness and fatigue.  Soreness and body aches. These side effects can affect parts of the body that were not involved in surgery. Follow these instructions at home:  For at least 24 hours after the procedure:  Have a responsible adult stay with you. It is important to have someone help care for you until you are awake and alert.  Rest as needed.  Do not: ? Participate in activities in which you could fall or become injured. ? Drive. ? Use heavy machinery. ? Drink alcohol. ? Take sleeping pills or medicines that cause drowsiness. ? Make important decisions or sign legal documents. ? Take care of children on your own. Eating and drinking  Follow any instructions from your health care provider about eating or drinking restrictions.  When you feel hungry, start by eating small amounts of foods that are soft and easy to digest (bland), such as toast. Gradually return to your regular diet.  Drink enough fluid to keep your urine pale  yellow.  If you vomit, rehydrate by drinking water, juice, or clear broth. General instructions  If you have sleep apnea, surgery and certain medicines can increase your risk for breathing problems. Follow instructions from your health care provider about wearing your sleep device: ? Anytime you are sleeping, including during daytime naps. ? While taking prescription pain medicines, sleeping medicines, or medicines that make you drowsy.  Return to your normal activities as told by your health care provider. Ask your health care provider what activities are safe for you.  Take over-the-counter and prescription medicines only as told by your health care provider.  If you smoke, do not smoke without supervision.  Keep all follow-up visits as told by your health care provider. This is important. Contact  a health care provider if:  You have nausea or vomiting that does not get better with medicine.  You cannot eat or drink without vomiting.  You have pain that does not get better with medicine.  You are unable to pass urine.  You develop a skin rash.  You have a fever.  You have redness around your IV site that gets worse. Get help right away if:  You have difficulty breathing.  You have chest pain.  You have blood in your urine or stool, or you vomit blood. Summary  After the procedure, it is common to have a sore throat or nausea. It is also common to feel tired.  Have a responsible adult stay with you for the first 24 hours after general anesthesia. It is important to have someone help care for you until you are awake and alert.  When you feel hungry, start by eating small amounts of foods that are soft and easy to digest (bland), such as toast. Gradually return to your regular diet.  Drink enough fluid to keep your urine pale yellow.  Return to your normal activities as told by your health care provider. Ask your health care provider what activities are safe for  you. This information is not intended to replace advice given to you by your health care provider. Make sure you discuss any questions you have with your health care provider. Document Revised: 09/18/2017 Document Reviewed: 05/01/2017 Elsevier Patient Education  [place]. How to Use Chlorhexidine for Bathing Chlorhexidine gluconate (CHG) is a germ-killing (antiseptic) solution that is used to clean the skin. It can get rid of the bacteria that normally live on the skin and can keep them away for about 24 hours. To clean your skin with CHG, you may be given:  A CHG solution to use in the shower or as part of a sponge bath.  A prepackaged cloth that contains CHG. Cleaning your skin with CHG may help lower the risk for infection:  While you are staying in the intensive care unit of the hospital.  If you have a vascular access, such as a central line, to provide short-term or long-term access to your veins.  If you have a catheter to drain urine from your bladder.  If you are on a ventilator. A ventilator is a machine that helps you breathe by moving air in and out of your lungs.  After surgery. What are the risks? Risks of using CHG include:  A skin reaction.  Hearing loss, if CHG gets in your ears.  Eye injury, if CHG gets in your eyes and is not rinsed out.  The CHG product catching fire. Make sure that you avoid smoking and flames after applying CHG to your skin. Do not use CHG:  If you have a chlorhexidine allergy or have previously reacted to chlorhexidine.  On babies younger than 26 months of age. How to use CHG solution  Use CHG only as told by your health care provider, and follow the instructions on the label.  Use the full amount of CHG as directed. Usually, this is one bottle. During a shower Follow these steps when using CHG solution during a shower (unless your health care provider gives you different instructions): 1. Start the shower. 2. Use your  normal soap and shampoo to wash your face and hair. 3. Turn off the shower or move out of the shower stream. 4. Pour the CHG onto a clean washcloth. Do not use any type  of brush or rough-edged sponge. 5. Starting at your neck, lather your body down to your toes. Make sure you follow these instructions: ? If you will be having surgery, pay special attention to the part of your body where you will be having surgery. Scrub this area for at least 1 minute. ? Do not use CHG on your head or face. If the solution gets into your ears or eyes, rinse them well with water. ? Avoid your genital area. ? Avoid any areas of skin that have broken skin, cuts, or scrapes. ? Scrub your back and under your arms. Make sure to wash skin folds. 6. Let the lather sit on your skin for 1-2 minutes or as long as told by your health care provider. 7. Thoroughly rinse your entire body in the shower. Make sure that all body creases and crevices are rinsed well. 8. Dry off with a clean towel. Do not put any substances on your body afterward--such as powder, lotion, or perfume--unless you are told to do so by your health care provider. Only use lotions that are recommended by the manufacturer. 9. Put on clean clothes or pajamas. 10. If it is the night before your surgery, sleep in clean sheets.  During a sponge bath Follow these steps when using CHG solution during a sponge bath (unless your health care provider gives you different instructions): 1. Use your normal soap and shampoo to wash your face and hair. 2. Pour the CHG onto a clean washcloth. 3. Starting at your neck, lather your body down to your toes. Make sure you follow these instructions: ? If you will be having surgery, pay special attention to the part of your body where you will be having surgery. Scrub this area for at least 1 minute. ? Do not use CHG on your head or face. If the solution gets into your ears or eyes, rinse them well with water. ? Avoid your  genital area. ? Avoid any areas of skin that have broken skin, cuts, or scrapes. ? Scrub your back and under your arms. Make sure to wash skin folds. 4. Let the lather sit on your skin for 1-2 minutes or as long as told by your health care provider. 5. Using a different clean, wet washcloth, thoroughly rinse your entire body. Make sure that all body creases and crevices are rinsed well. 6. Dry off with a clean towel. Do not put any substances on your body afterward--such as powder, lotion, or perfume--unless you are told to do so by your health care provider. Only use lotions that are recommended by the manufacturer. 7. Put on clean clothes or pajamas. 8. If it is the night before your surgery, sleep in clean sheets. How to use CHG prepackaged cloths  Only use CHG cloths as told by your health care provider, and follow the instructions on the label.  Use the CHG cloth on clean, dry skin.  Do not use the CHG cloth on your head or face unless your health care provider tells you to.  When washing with the CHG cloth: ? Avoid your genital area. ? Avoid any areas of skin that have broken skin, cuts, or scrapes. Before surgery Follow these steps when using a CHG cloth to clean before surgery (unless your health care provider gives you different instructions): 1. Using the CHG cloth, vigorously scrub the part of your body where you will be having surgery. Scrub using a back-and-forth motion for 3 minutes. The area on your body  should be completely wet with CHG when you are done scrubbing. 2. Do not rinse. Discard the cloth and let the area air-dry. Do not put any substances on the area afterward, such as powder, lotion, or perfume. 3. Put on clean clothes or pajamas. 4. If it is the night before your surgery, sleep in clean sheets.  For general bathing Follow these steps when using CHG cloths for general bathing (unless your health care provider gives you different instructions). 1. Use a  separate CHG cloth for each area of your body. Make sure you wash between any folds of skin and between your fingers and toes. Wash your body in the following order, switching to a new cloth after each step: ? The front of your neck, shoulders, and chest. ? Both of your arms, under your arms, and your hands. ? Your stomach and groin area, avoiding the genitals. ? Your right leg and foot. ? Your left leg and foot. ? The back of your neck, your back, and your buttocks. 2. Do not rinse. Discard the cloth and let the area air-dry. Do not put any substances on your body afterward--such as powder, lotion, or perfume--unless you are told to do so by your health care provider. Only use lotions that are recommended by the manufacturer. 3. Put on clean clothes or pajamas. Contact a health care provider if:  Your skin gets irritated after scrubbing.  You have questions about using your solution or cloth. Get help right away if:  Your eyes become very red or swollen.  Your eyes itch badly.  Your skin itches badly and is red or swollen.  Your hearing changes.  You have trouble seeing.  You have swelling or tingling in your mouth or throat.  You have trouble breathing.  You swallow any chlorhexidine. Summary  Chlorhexidine gluconate (CHG) is a germ-killing (antiseptic) solution that is used to clean the skin. Cleaning your skin with CHG may help to lower your risk for infection.  You may be given CHG to use for bathing. It may be in a bottle or in a prepackaged cloth to use on your skin. Carefully follow your health care provider's instructions and the instructions on the product label.  Do not use CHG if you have a chlorhexidine allergy.  Contact your health care provider if your skin gets irritated after scrubbing. This information is not intended to replace advice given to you by your health care provider. Make sure you discuss any questions you have with your health care  provider. Document Revised: 12/02/2018 Document Reviewed: 08/13/2017 Elsevier Patient Education  [place].

## 2020-03-07 ENCOUNTER — Other Ambulatory Visit: Payer: Self-pay

## 2020-03-07 ENCOUNTER — Other Ambulatory Visit (HOSPITAL_COMMUNITY)
Admission: RE | Admit: 2020-03-07 | Discharge: 2020-03-07 | Disposition: A | Discharge status: Home / Self Care | Payer: BC Managed Care – PPO | Source: Ambulatory Visit | Attending: General Surgery | Admitting: General Surgery

## 2020-03-07 ENCOUNTER — Encounter (HOSPITAL_COMMUNITY)
Admission: RE | Admit: 2020-03-07 | Discharge: 2020-03-07 | Disposition: A | Discharge status: Home / Self Care | Payer: BC Managed Care – PPO | Source: Ambulatory Visit | Attending: General Surgery | Admitting: General Surgery

## 2020-03-07 ENCOUNTER — Encounter (HOSPITAL_COMMUNITY): Payer: Self-pay

## 2020-03-07 DIAGNOSIS — Z01812 Encounter for preprocedural laboratory examination: Secondary | ICD-10-CM | POA: Insufficient documentation

## 2020-03-07 DIAGNOSIS — Z20822 Contact with and (suspected) exposure to covid-19: Secondary | ICD-10-CM | POA: Insufficient documentation

## 2020-03-07 HISTORY — DX: Other specified postprocedural states: Z98.890

## 2020-03-07 HISTORY — DX: Nausea with vomiting, unspecified: R11.2

## 2020-03-07 LAB — HCG, SERUM, QUALITATIVE: Preg, Serum: NEGATIVE

## 2020-03-07 LAB — CBC WITH DIFFERENTIAL/PLATELET
Abs Immature Granulocytes: 0.01 10*3/uL (ref 0.00–0.07)
Basophils Absolute: 0 10*3/uL (ref 0.0–0.1)
Basophils Relative: 0 %
Eosinophils Absolute: 0.1 10*3/uL (ref 0.0–0.5)
Eosinophils Relative: 1 %
HCT: 35.2 % — ABNORMAL LOW (ref 36.0–46.0)
Hemoglobin: 11.6 g/dL — ABNORMAL LOW (ref 12.0–15.0)
Immature Granulocytes: 0 %
Lymphocytes Relative: 21 %
Lymphs Abs: 1.5 10*3/uL (ref 0.7–4.0)
MCH: 30 pg (ref 26.0–34.0)
MCHC: 33 g/dL (ref 30.0–36.0)
MCV: 91 fL (ref 80.0–100.0)
Monocytes Absolute: 0.5 10*3/uL (ref 0.1–1.0)
Monocytes Relative: 6 %
Neutro Abs: 5.3 10*3/uL (ref 1.7–7.7)
Neutrophils Relative %: 72 %
Platelets: 274 10*3/uL (ref 150–400)
RBC: 3.87 MIL/uL (ref 3.87–5.11)
RDW: 12.7 % (ref 11.5–15.5)
WBC: 7.4 10*3/uL (ref 4.0–10.5)
nRBC: 0 % (ref 0.0–0.2)

## 2020-03-08 LAB — SARS CORONAVIRUS 2 (TAT 6-24 HRS): SARS Coronavirus 2: NEGATIVE

## 2020-03-09 ENCOUNTER — Encounter (HOSPITAL_COMMUNITY)
Admission: RE | Disposition: A | Discharge status: Home / Self Care | Payer: Self-pay | Source: Home / Self Care | Attending: General Surgery

## 2020-03-09 ENCOUNTER — Ambulatory Visit (HOSPITAL_COMMUNITY): Payer: BC Managed Care – PPO | Admitting: Anesthesiology

## 2020-03-09 ENCOUNTER — Ambulatory Visit (HOSPITAL_COMMUNITY)
Admission: RE | Admit: 2020-03-09 | Discharge: 2020-03-09 | Disposition: A | Discharge status: Home / Self Care | Payer: BC Managed Care – PPO | Attending: General Surgery | Admitting: General Surgery

## 2020-03-09 DIAGNOSIS — Z87891 Personal history of nicotine dependence: Secondary | ICD-10-CM | POA: Diagnosis not present

## 2020-03-09 DIAGNOSIS — Z20822 Contact with and (suspected) exposure to covid-19: Secondary | ICD-10-CM | POA: Insufficient documentation

## 2020-03-09 DIAGNOSIS — F329 Major depressive disorder, single episode, unspecified: Secondary | ICD-10-CM | POA: Insufficient documentation

## 2020-03-09 DIAGNOSIS — G43909 Migraine, unspecified, not intractable, without status migrainosus: Secondary | ICD-10-CM | POA: Diagnosis not present

## 2020-03-09 DIAGNOSIS — K436 Other and unspecified ventral hernia with obstruction, without gangrene: Secondary | ICD-10-CM | POA: Diagnosis not present

## 2020-03-09 DIAGNOSIS — K439 Ventral hernia without obstruction or gangrene: Secondary | ICD-10-CM

## 2020-03-09 HISTORY — PX: VENTRAL HERNIA REPAIR: SHX424

## 2020-03-09 SURGERY — REPAIR, HERNIA, VENTRAL
Anesthesia: General | Site: Abdomen

## 2020-03-09 MED ORDER — MIDAZOLAM HCL 2 MG/2ML IJ SOLN
INTRAMUSCULAR | Status: DC | PRN
  Administered 2020-03-09: 2 mg via INTRAVENOUS

## 2020-03-09 MED ORDER — POVIDONE-IODINE 10 % EX OINT
TOPICAL_OINTMENT | CUTANEOUS | Status: AC
  Filled 2020-03-09: qty 1

## 2020-03-09 MED ORDER — DEXMEDETOMIDINE HCL 200 MCG/2ML IV SOLN
INTRAVENOUS | Status: DC | PRN
  Administered 2020-03-09: 20 ug via INTRAVENOUS

## 2020-03-09 MED ORDER — HYDROMORPHONE HCL 1 MG/ML IJ SOLN
0.2500 mg | INTRAMUSCULAR | Status: DC | PRN
  Administered 2020-03-09: 0.5 mg via INTRAVENOUS
  Filled 2020-03-09: qty 0.5

## 2020-03-09 MED ORDER — DEXAMETHASONE SODIUM PHOSPHATE 10 MG/ML IJ SOLN
INTRAMUSCULAR | Status: DC | PRN
  Administered 2020-03-09: 5 mg via INTRAVENOUS

## 2020-03-09 MED ORDER — ONDANSETRON HCL 4 MG/2ML IJ SOLN
INTRAMUSCULAR | Status: AC
  Filled 2020-03-09: qty 2

## 2020-03-09 MED ORDER — DEXAMETHASONE SODIUM PHOSPHATE 10 MG/ML IJ SOLN
INTRAMUSCULAR | Status: AC
  Filled 2020-03-09: qty 1

## 2020-03-09 MED ORDER — ORAL CARE MOUTH RINSE: 15.0000 mL | Freq: Once | OROMUCOSAL | Status: AC

## 2020-03-09 MED ORDER — ONDANSETRON HCL 4 MG/2ML IJ SOLN
INTRAMUSCULAR | Status: DC | PRN
  Administered 2020-03-09: 4 mg via INTRAVENOUS

## 2020-03-09 MED ORDER — BUPIVACAINE LIPOSOME 1.3 % IJ SUSP
INTRAMUSCULAR | Status: AC
  Filled 2020-03-09: qty 20

## 2020-03-09 MED ORDER — MEPERIDINE HCL 50 MG/ML IJ SOLN: 6.2500 mg | INTRAMUSCULAR | Status: DC | PRN

## 2020-03-09 MED ORDER — CHLORHEXIDINE GLUCONATE 0.12 % MT SOLN
15.0000 mL | Freq: Once | OROMUCOSAL | Status: AC
  Administered 2020-03-09: 15 mL via OROMUCOSAL
  Filled 2020-03-09: qty 15

## 2020-03-09 MED ORDER — EPHEDRINE 5 MG/ML INJ
INTRAVENOUS | Status: AC
  Filled 2020-03-09: qty 10

## 2020-03-09 MED ORDER — KETOROLAC TROMETHAMINE 30 MG/ML IJ SOLN
INTRAMUSCULAR | Status: DC | PRN
  Administered 2020-03-09: 30 mg via INTRAVENOUS

## 2020-03-09 MED ORDER — MIDAZOLAM HCL 2 MG/2ML IJ SOLN
INTRAMUSCULAR | Status: AC
  Filled 2020-03-09: qty 2

## 2020-03-09 MED ORDER — LIDOCAINE 2% (20 MG/ML) 5 ML SYRINGE
INTRAMUSCULAR | Status: AC
  Filled 2020-03-09: qty 5

## 2020-03-09 MED ORDER — FENTANYL CITRATE (PF) 100 MCG/2ML IJ SOLN
INTRAMUSCULAR | Status: AC
  Filled 2020-03-09: qty 2

## 2020-03-09 MED ORDER — CHLORHEXIDINE GLUCONATE CLOTH 2 % EX PADS: 6.0000 | MEDICATED_PAD | Freq: Once | CUTANEOUS | Status: DC

## 2020-03-09 MED ORDER — EPHEDRINE SULFATE 50 MG/ML IJ SOLN
INTRAMUSCULAR | Status: DC | PRN
  Administered 2020-03-09: 10 mg via INTRAVENOUS

## 2020-03-09 MED ORDER — PROPOFOL 10 MG/ML IV BOLUS
INTRAVENOUS | Status: AC
  Filled 2020-03-09: qty 40

## 2020-03-09 MED ORDER — 0.9 % SODIUM CHLORIDE (POUR BTL) OPTIME
TOPICAL | Status: DC | PRN
  Administered 2020-03-09: 1000 mL

## 2020-03-09 MED ORDER — DOCUSATE SODIUM 100 MG PO CAPS
100.0000 mg | ORAL_CAPSULE | Freq: Two times a day (BID) | ORAL | 2 refills | Status: DC
Start: 2020-03-09 — End: 2020-12-10

## 2020-03-09 MED ORDER — LACTATED RINGERS IV SOLN
INTRAVENOUS | Status: DC | PRN
Start: 2020-03-09 — End: 2020-03-09

## 2020-03-09 MED ORDER — SUGAMMADEX SODIUM 500 MG/5ML IV SOLN
INTRAVENOUS | Status: DC | PRN
  Administered 2020-03-09: 200 mg via INTRAVENOUS

## 2020-03-09 MED ORDER — ROCURONIUM BROMIDE 10 MG/ML (PF) SYRINGE
PREFILLED_SYRINGE | INTRAVENOUS | Status: AC
  Filled 2020-03-09: qty 10

## 2020-03-09 MED ORDER — SCOPOLAMINE 1 MG/3DAYS TD PT72
1.0000 | MEDICATED_PATCH | Freq: Once | TRANSDERMAL | Status: DC
  Administered 2020-03-09: 1.5 mg via TRANSDERMAL

## 2020-03-09 MED ORDER — PROPOFOL 10 MG/ML IV BOLUS
INTRAVENOUS | Status: DC | PRN
  Administered 2020-03-09: 25 ug/kg/min via INTRAVENOUS
  Administered 2020-03-09: 150 mg via INTRAVENOUS

## 2020-03-09 MED ORDER — ONDANSETRON HCL 4 MG/2ML IJ SOLN
4.0000 mg | Freq: Once | INTRAMUSCULAR | Status: AC | PRN
  Administered 2020-03-09: 4 mg via INTRAVENOUS
  Filled 2020-03-09: qty 2

## 2020-03-09 MED ORDER — CEFAZOLIN SODIUM-DEXTROSE 2-4 GM/100ML-% IV SOLN
2.0000 g | INTRAVENOUS | Status: AC
  Administered 2020-03-09: 2 g via INTRAVENOUS
  Filled 2020-03-09: qty 100

## 2020-03-09 MED ORDER — LIDOCAINE HCL (CARDIAC) PF 50 MG/5ML IV SOSY
PREFILLED_SYRINGE | INTRAVENOUS | Status: DC | PRN
  Administered 2020-03-09: 100 mg via INTRAVENOUS

## 2020-03-09 MED ORDER — KETOROLAC TROMETHAMINE 30 MG/ML IJ SOLN
INTRAMUSCULAR | Status: AC
  Filled 2020-03-09: qty 1

## 2020-03-09 MED ORDER — LACTATED RINGERS IV SOLN: Freq: Once | INTRAVENOUS | Status: AC

## 2020-03-09 MED ORDER — ONDANSETRON HCL 4 MG PO TABS: 4.0000 mg | ORAL_TABLET | Freq: Every day | ORAL | 1 refills | Status: DC | PRN

## 2020-03-09 MED ORDER — SCOPOLAMINE 1 MG/3DAYS TD PT72
MEDICATED_PATCH | TRANSDERMAL | Status: AC
  Filled 2020-03-09: qty 1

## 2020-03-09 MED ORDER — ROCURONIUM BROMIDE 100 MG/10ML IV SOLN
INTRAVENOUS | Status: DC | PRN
  Administered 2020-03-09: 30 mg via INTRAVENOUS

## 2020-03-09 MED ORDER — BUPIVACAINE LIPOSOME 1.3 % IJ SUSP
INTRAMUSCULAR | Status: DC | PRN
  Administered 2020-03-09: 20 mL

## 2020-03-09 MED ORDER — TRAMADOL HCL 50 MG PO TABS: 50.0000 mg | ORAL_TABLET | Freq: Four times a day (QID) | ORAL | 0 refills | Status: DC | PRN

## 2020-03-09 MED ORDER — FENTANYL CITRATE (PF) 100 MCG/2ML IJ SOLN
INTRAMUSCULAR | Status: DC | PRN
  Administered 2020-03-09: 100 ug via INTRAVENOUS

## 2020-03-09 SURGICAL SUPPLY — 38 items
CHLORAPREP W/TINT 26 (MISCELLANEOUS) ×3 IMPLANT
CLOTH BEACON ORANGE TIMEOUT ST (SAFETY) ×3 IMPLANT
COVER LIGHT HANDLE STERIS (MISCELLANEOUS) ×6 IMPLANT
COVER WAND RF STERILE (DRAPES) ×3 IMPLANT
DERMABOND ADVANCED (GAUZE/BANDAGES/DRESSINGS) ×2
DERMABOND ADVANCED .7 DNX12 (GAUZE/BANDAGES/DRESSINGS) ×1 IMPLANT
ELECT REM PT RETURN 9FT ADLT (ELECTROSURGICAL) ×3
ELECTRODE REM PT RTRN 9FT ADLT (ELECTROSURGICAL) ×1 IMPLANT
GAUZE SPONGE 4X4 12PLY STRL (GAUZE/BANDAGES/DRESSINGS) ×3 IMPLANT
GLOVE BIO SURGEON STRL SZ 6.5 (GLOVE) ×2 IMPLANT
GLOVE BIO SURGEONS STRL SZ 6.5 (GLOVE) ×1
GLOVE BIOGEL PI IND STRL 6.5 (GLOVE) ×1 IMPLANT
GLOVE BIOGEL PI IND STRL 7.0 (GLOVE) ×3 IMPLANT
GLOVE BIOGEL PI INDICATOR 6.5 (GLOVE) ×2
GLOVE BIOGEL PI INDICATOR 7.0 (GLOVE) ×6
GLOVE ECLIPSE 6.5 STRL STRAW (GLOVE) ×6 IMPLANT
GLOVE SURG SS PI 7.5 STRL IVOR (GLOVE) IMPLANT
GOWN STRL REUS W/TWL LRG LVL3 (GOWN DISPOSABLE) ×6 IMPLANT
INST SET MINOR GENERAL (KITS) ×3 IMPLANT
KIT TURNOVER KIT A (KITS) ×3 IMPLANT
MANIFOLD NEPTUNE II (INSTRUMENTS) ×3 IMPLANT
NEEDLE HYPO 25X1 1.5 SAFETY (NEEDLE) ×3 IMPLANT
NS IRRIG 1000ML POUR BTL (IV SOLUTION) ×3 IMPLANT
PACK MAJOR ABDOMINAL (CUSTOM PROCEDURE TRAY) ×3 IMPLANT
PAD ARMBOARD 7.5X6 YLW CONV (MISCELLANEOUS) ×3 IMPLANT
SET BASIN LINEN APH (SET/KITS/TRAYS/PACK) ×3 IMPLANT
SUT ETHIBOND NAB MO 7 #0 18IN (SUTURE) ×3 IMPLANT
SUT MNCRL AB 4-0 PS2 18 (SUTURE) ×3 IMPLANT
SUT NOVA NAB GS-21 1 T12 (SUTURE) IMPLANT
SUT NOVA NAB GS-22 2 2-0 T-19 (SUTURE) IMPLANT
SUT NOVA NAB GS-26 0 60 (SUTURE) IMPLANT
SUT SILK 2 0 (SUTURE)
SUT SILK 2-0 18XBRD TIE 12 (SUTURE) IMPLANT
SUT VIC AB 2-0 CT2 27 (SUTURE) IMPLANT
SUT VIC AB 3-0 SH 27 (SUTURE) ×3
SUT VIC AB 3-0 SH 27X BRD (SUTURE) ×1 IMPLANT
SUT VICRYL AB 2 0 TIES (SUTURE) IMPLANT
SYR 20ML LL LF (SYRINGE) ×3 IMPLANT

## 2020-03-09 NOTE — Progress Notes (Addendum)
[facility]  Notified mom surgery is completed. Tramadol opted for pain given her sensitivity. If this is not working can call and can send in something stronger.  Will see in about 4 weeks.  Can return to work with lifting restrictions on 03/16/2020. If she needs more time, she can call the office and have this extended until 03/26/2020.   Curlene Labrum, MD [facility] [address] [place], [place] [address] [phone] (office)

## 2020-03-09 NOTE — Anesthesia Postprocedure Evaluation (Signed)
Anesthesia Post Note  Patient: Sherri Salazar  Procedure(s) Performed: VENTRAL HERNIA REPAIR (N/A Abdomen)  Patient location during evaluation: PACU Anesthesia Type: General Level of consciousness: awake, awake and alert, oriented and patient cooperative Pain management: pain level controlled Vital Signs Assessment: post-procedure vital signs reviewed and stable Respiratory status: spontaneous breathing, respiratory function stable and nonlabored ventilation Cardiovascular status: blood pressure returned to baseline and stable Postop Assessment: no headache and no backache Anesthetic complications: no   No complications documented.   Last Vitals:  Vitals:   03/09/20 0651  BP: 113/72  Pulse: 63  Resp: 15  Temp: 36.8 C  SpO2: 99%    Last Pain:  Vitals:   03/09/20 0651  TempSrc: Oral  PainSc: 0-No pain                 Tacy Learn

## 2020-03-09 NOTE — H&P (Signed)
[facility] History and Physical  Reason for Referral: Epigastric Hernia     Sherri Salazar is a 54 y.o. female.  HPI: Ms. Sherri Salazar is a 54 yo with a history of an epigastric hernia that had been bothering her for over a year. She saw me back in March and wanted to get her repair after school was released.  The hernia was only partially reducible. She complained of pain with activity and exercise, and said it had been getting larger.  She says recently it has been more painful and tender. She denies any nausea, vomiting or obstructive symptoms.  Nothing has changed in her medical history since I saw her last.   Past Medical History:  Diagnosis Date  . Migraines   . PONV (postoperative nausea and vomiting)     Past Surgical History:  Procedure Laterality Date  . ABLATION    . CESAREAN SECTION  2000,2002    Family History  Problem Relation Age of Onset  . Parkinson's disease Father     Social History   Tobacco Use  . Smoking status: Former Smoker    Types: Cigarettes    Quit date: 09/29/1985    Years since quitting: 34.4  . Smokeless tobacco: Never Used  . Tobacco comment: tried but not consistent  Vaping Use  . Vaping Use: Never used  Substance Use Topics  . Alcohol use: No  . Drug use: No    Medications: I have reviewed the patient's current medications. Current Facility-Administered Medications  Medication Dose Route Frequency Provider Last Rate Last Admin  . ceFAZolin (ANCEF) IVPB 2g/100 mL premix  2 g Intravenous On Call to OR Virl Cagey, MD      . Chlorhexidine Gluconate Cloth 2 % PADS 6 each  6 each Topical Once Virl Cagey, MD       And  . Chlorhexidine Gluconate Cloth 2 % PADS 6 each  6 each Topical Once Virl Cagey, MD      . scopolamine (TRANSDERM-SCOP) 1 MG/3DAYS 1.5 mg  1 patch Transdermal Once Battula, Rajamani C, MD   1.5 mg at 03/09/20 0713   Allergies  Allergen Reactions  . Vicodin  [Hydrocodone-Acetaminophen] Other (See Comments)    hallucinate      ROS:  A comprehensive review of systems was negative except for: Gastrointestinal: positive for abdominal pain  Blood pressure 113/72, pulse 63, temperature 98.3 F (36.8 C), temperature source Oral, resp. rate 15, height 5\' 2"  (1.575 m), weight 66.7 kg, SpO2 99 %. Physical Exam Vitals reviewed.  Constitutional:      Appearance: She is normal weight.  HENT:     Head: Normocephalic and atraumatic.  Eyes:     Pupils: Pupils are equal, round, and reactive to light.  Cardiovascular:     Rate and Rhythm: Normal rate.  Pulmonary:     Effort: Pulmonary effort is normal.     Breath sounds: Normal breath sounds.  Abdominal:     General: There is no distension.     Palpations: Abdomen is soft.     Tenderness: There is abdominal tenderness.     Hernia: A hernia is present.     Comments: Epigastric hernia, incarcerated fat  Musculoskeletal:        General: No swelling. Normal range of motion.  Skin:    General: Skin is warm.  Neurological:     General: No focal deficit present.     Mental Status: She is alert and oriented to  person, place, and time.  Psychiatric:        Mood and Affect: Mood normal.        Behavior: Behavior normal.        Thought Content: Thought content normal.        Judgment: Judgment normal.     Results: Results for orders placed or performed during the hospital encounter of 03/07/20 (from the past 48 hour(s))  SARS CORONAVIRUS 2 (TAT 6-24 HRS) Nasopharyngeal Nasopharyngeal Swab     Status: None   Collection Time: 03/07/20  2:57 PM   Specimen: Nasopharyngeal Swab  Result Value Ref Range   SARS Coronavirus 2 NEGATIVE NEGATIVE    Comment: (NOTE) SARS-CoV-2 target nucleic acids are NOT DETECTED.  The SARS-CoV-2 RNA is generally detectable in upper and lower respiratory specimens during the acute phase of infection. Negative results do not preclude SARS-CoV-2 infection, do not rule  out co-infections with other pathogens, and should not be used as the sole basis for treatment or other patient management decisions. Negative results must be combined with clinical observations, patient history, and epidemiological information. The expected result is Negative.  Fact Sheet for Patients: SugarRoll.be  Fact Sheet for Healthcare Providers: https://www.woods-mathews.com/  This test is not yet approved or cleared by the [place] FDA and  has been authorized for detection and/or diagnosis of SARS-CoV-2 by FDA under an Emergency Use Authorization (EUA). This EUA will remain  in effect (meaning this test can be used) for the duration of the COVID-19 declaration under Se ction 564(b)(1) of the Act, 21 U.S.C. section 360bbb-3(b)(1), unless the authorization is terminated or revoked sooner.  Performed at [facility], [place] [address] [place], [place] [address]   CBC with Differential/Platelet     Status: Abnormal   Collection Time: 03/07/20  3:14 PM  Result Value Ref Range   WBC 7.4 4.0 - 10.5 K/uL   RBC 3.87 3.87 - 5.11 MIL/uL   Hemoglobin 11.6 (L) 12.0 - 15.0 g/dL   HCT 35.2 (L) 36 - 46 %   MCV 91.0 80.0 - 100.0 fL   MCH 30.0 26.0 - 34.0 pg   MCHC 33.0 30.0 - 36.0 g/dL   RDW 12.7 11.5 - 15.5 %   Platelets 274 150 - 400 K/uL   nRBC 0.0 0.0 - 0.2 %   Neutrophils Relative % 72 %   Neutro Abs 5.3 1.7 - 7.7 K/uL   Lymphocytes Relative 21 %   Lymphs Abs 1.5 0.7 - 4.0 K/uL   Monocytes Relative 6 %   Monocytes Absolute 0.5 0 - 1 K/uL   Eosinophils Relative 1 %   Eosinophils Absolute 0.1 0 - 0 K/uL   Basophils Relative 0 %   Basophils Absolute 0.0 0 - 0 K/uL   Immature Granulocytes 0 %   Abs Immature Granulocytes 0.01 0.00 - 0.07 K/uL    Comment: Performed at [facility], [address], [place], [place] [address]  hCG, serum, qualitative (Not at [facility])     Status: None   Collection Time: 03/07/20  3:14 PM  Result  Value Ref Range   Preg, Serum NEGATIVE NEGATIVE    Comment:        THE SENSITIVITY OF THIS METHODOLOGY IS >10 mIU/mL. Performed at [facility], [address] [place],  [address]      None   Assessment & Plan:  Sherri Salazar is a 54 y.o. female with an epigastric hernia. We plan for repair with possible mesh.  Discussed risk of bleeding, infection, recurrence, possible use of mes..    All questions were answered to the satisfaction of the patient.   Virl Cagey 03/09/2020, 7:18 AM

## 2020-03-09 NOTE — Op Note (Signed)
[facility] Operative Note  03/09/20  Preoperative Diagnosis: Incarcerated Epigastric hernia    Postoperative Diagnosis: Same   Procedure(s) Performed:  Primary epigastric hernia repair (1cm defect)    Surgeon: Lanell Matar. Constance Haw, MD   Assistants: No qualified resident was available    Anesthesia: General endotracheal   Anesthesiologist: Denese Killings, MD    Specimens:  None   Estimated Blood Loss: Minimal   Blood Replacement: None    Complications: None   Wound Class: Clean    Operative Indications: Ms. Sherri Salazar is a very sweet 54 yo with an epigastric hernia with fat that is incarcerated. We discussed repair and risk of bleeding, infection, possible use of mesh, and she opted to proceed.   Findings: Epigastric hernia with fat, appeared preperitoneal, excised and was intraperitoneal over the liver    Procedure: The patient was taken to the operating room and placed supine. General endotracheal anesthesia was induced. Intravenous antibiotics were  administered per protocol.  The abdomen was prepared and draped in the usual sterile fashion.  I had marked over the epigastric hernia to ensure that I did not lose the location if it reduced.   The hernia was noted to be incarcerated.  An incision was made in the midline over the hernia, and carried down through the subcutaneous tissue with electrocautery.  Dissection was performed down to the level of the fascia, exposing the protruding preperitoneal fat. This fat was excised and on excision the peritoneum was entered and the liver weas noted below. The hernia defect was < 1cm in size, and to get a mesh in there I would have to extend this defect. Given the size, I opted for a primary repair with vest over pants interrupted sutures of 0 Ethibond. The defect was closed without tension. The deep space was closed with interrupted 3-0 Vicryl suture.   Hemostasis was confirmed. The skin was closed with a running 4-0  Monocryl suture and dermabond.     All counts were correct at the end of the case. The patient was awakened from anesthesia and extubated without complication.  The patient went to the PACU in stable condition.  Sherri Labrum, MD [facility] [address] [place], [place] [address] [phone] (office)

## 2020-03-09 NOTE — Transfer of Care (Signed)
Immediate Anesthesia Transfer of Care Note  Patient: Sherri Salazar  Procedure(s) Performed: VENTRAL HERNIA REPAIR (N/A Abdomen)  Patient Location: PACU  Anesthesia Type:General  Level of Consciousness: drowsy, patient cooperative and responds to stimulation  Airway & Oxygen Therapy: Patient Spontanous Breathing  Post-op Assessment: Report given to RN, Post -op Vital signs reviewed and stable and Patient moving all extremities  Post vital signs: Reviewed and stable  Last Vitals:  Vitals Value Taken Time  BP    Temp    Pulse 70 03/09/20 0813  Resp 18 03/09/20 0813  SpO2 95 % 03/09/20 0813  Vitals shown include unvalidated device data.  Last Pain:  Vitals:   03/09/20 0651  TempSrc: Oral  PainSc: 0-No pain      Patients Stated Pain Goal: 6 (65/68/12 7517)  Complications: No complications documented.

## 2020-03-09 NOTE — Discharge Instructions (Signed)
[facility] THE [place] EXPAREL [facility] UNTIL Tuesday March 13, 2020. DO NOT USE ADDITIONAL NUMBING MEDICATIONS WITHOUT CONSULTING A PHYSICIAN UNTIL AFTER March 13, 2020.   PLEASE REMOVE THE SCOPOLAMINE PATCH FOR CONTROL OF NAUSEA BEHIND YOUR RIGHT EAR ON Monday March 12, 2020. [place] [facility] AFTER REMOVAL.        Discharge Instructions Hernia:  Common Complaints: Pain at the incision site is common. This will improve with time. Take your pain medications as described below. Some nausea is common and poor appetite. The main goal is to stay hydrated the first few days after surgery.   Diet/ Activity: Diet as tolerated. You may not have an appetite, but it is important to stay hydrated. Drink 64 ounces of water a day. Your appetite will return with time.  Shower per your regular routine daily.  Do not take hot showers. Take warm showers that are less than 10 minutes. Rest and listen to your body, but do not remain in bed all day.Walk everyday for at least 15-20 minutes.  Deep cough and move around every 1-2 hours in the first few days after surgery. Do not pick at the dermabond glue on your incision sites.  This glue film will remain in place for 1-2 weeks and will start to peel off. Do not place lotions or balms on your incision unless instructed to specifically by Dr. Constance Haw. Do not lift > 10 lbs, perform excessive bending, pushing, pulling, squatting for 6-8 weeks after surgery. Where your abdominal binder with activity as much as possible. The activity restrictions and the abdominal binder are to prevent hernia formation at your incision while you are healing.   Pain Expectations and Narcotics: -After surgery you will have pain associated with your incisions and this is normal. The pain is muscular and nerve pain, and will get better with time. -You are encouraged and expected to take non narcotic medications like tylenol and ibuprofen (when able) to treat pain as multiple modalities can aid  with pain treatment. -Narcotics are only used when pain is severe or there is breakthrough pain. -You are not expected to have a pain score of 0 after surgery, as we cannot prevent pain. A pain score of 3-4 that allows you to be functional, move, walk, and tolerate some activity is the goal. The pain will continue to improve over the days after surgery and is dependent on your surgery. -Due to [place] law, we are only able to give a certain amount of pain medication to treat post operative pain, and we only give additional narcotics on a patient by patient basis.  -For most laparoscopic surgery, studies have shown that the majority of patients only need 10-15 narcotic pills, and for open surgeries most patients only need 15-20.   -Having appropriate expectations of pain and knowledge of pain management with non narcotics is important as we do not want anyone to become addicted to narcotic pain medication.  -Using ice packs in the first 48 hours and heating pads after 48 hours, wearing an abdominal binder (when recommended), and using over the counter medications are all ways to help with pain management.   -Simple acts like meditation and mindfulness practices after surgery can also help with pain control and research has proven the benefit of these practices.  Medication: Take tylenol and ibuprofen as needed for pain control, alternating every 4-6 hours.  Example:  Tylenol 1000mg  @ 6am, 12noon, 6pm, 68midnight (Do not exceed 4000mg  of tylenol a day). Ibuprofen 800mg  @ 9am,  3pm, 9pm, 3am (Do not exceed 3600mg  of ibuprofen a day).  Take Roxicodone for breakthrough pain every 4 hours.  Take Colace for constipation related to narcotic pain medication. If you do not have a bowel movement in 2 days, take Miralax over the counter.  Drink plenty of water to also prevent constipation.   Contact Information: If you have questions or concerns, please call our office, [phone], Monday- Thursday 8AM-5PM and  Friday 8AM-12Noon.  If it is after hours or on the weekend, please call [facility] Main [facility], [phone], and ask to speak to the surgeon on call for Dr. Constance Haw at [facility].    Open Hernia Repair, Adult, Care After This sheet gives you information about how to care for yourself after your procedure. Your health care provider may also give you more specific instructions. If you have problems or questions, contact your health care provider. What can I expect after the procedure? After the procedure, it is common to have:  Mild discomfort.  Slight bruising.  Minor swelling.  Pain in the abdomen. Follow these instructions at home: Incision care   Follow instructions from your health care provider about how to take care of your incision area. Make sure you: ? Wash your hands with soap and water before you change your bandage (dressing). If soap and water are not available, use hand sanitizer. ? Change your dressing as told by your health care provider. ? Leave stitches (sutures), skin glue, or adhesive strips in place. These skin closures may need to stay in place for 2 weeks or longer. If adhesive strip edges start to loosen and curl up, you may trim the loose edges. Do not remove adhesive strips completely unless your health care provider tells you to do that.  Check your incision area every day for signs of infection. Check for: ? More redness, swelling, or pain. ? More fluid or blood. ? Warmth. ? Pus or a bad smell. Activity  Do not drive or use heavy machinery while taking prescription pain medicine. Do not drive until your health care provider approves.  Until your health care provider approves: ? Do not lift anything that is heavier than 10 lb (4.5 kg). ? Do not play contact sports.  Return to your normal activities as told by your health care provider. Ask your health care provider what activities are safe. General instructions  To prevent or treat constipation while  you are taking prescription pain medicine, your health care provider may recommend that you: ? Drink enough fluid to keep your urine clear or pale yellow. ? Take over-the-counter or prescription medicines. ? Eat foods that are high in fiber, such as fresh fruits and vegetables, whole grains, and beans. ? Limit foods that are high in fat and processed sugars, such as fried and sweet foods.  Take over-the-counter and prescription medicines only as told by your health care provider.  Do not take tub baths or go swimming until your health care provider approves.  You may shower.  Keep all follow-up visits as told by your health care provider. This is important. Contact a health care provider if:  You develop a rash.  You have more redness, swelling, or pain around your incision.  You have more fluid or blood coming from your incision.  Your incision feels warm to the touch.  You have pus or a bad smell coming from your incision.  You have a fever or chills.  You have blood in your stool (feces).  You  have not had a bowel movement in 2-3 days.  Your pain is not controlled with medicine. Get help right away if:  You have chest pain or shortness of breath.  You feel light-headed or feel faint.  You have severe pain.  You vomit and your pain is worse. This information is not intended to replace advice given to you by your health care provider. Make sure you discuss any questions you have with your health care provider. Document Revised: 08/28/2017 Document Reviewed: 02/27/2016 Elsevier Patient Education  2020 [place] Anesthesia, Adult, Care After This sheet gives you information about how to care for yourself after your procedure. Your health care provider may also give you more specific instructions. If you have problems or questions, contact your health care provider. What can I expect after the procedure? After the procedure, the following side effects are  common:  Pain or discomfort at the IV site.  Nausea.  Vomiting.  Sore throat.  Trouble concentrating.  Feeling cold or chills.  Weak or tired.  Sleepiness and fatigue.  Soreness and body aches. These side effects can affect parts of the body that were not involved in surgery. Follow these instructions at home:  For at least 24 hours after the procedure:  Have a responsible adult stay with you. It is important to have someone help care for you until you are awake and alert.  Rest as needed.  Do not: ? Participate in activities in which you could fall or become injured. ? Drive. ? Use heavy machinery. ? Drink alcohol. ? Take sleeping pills or medicines that cause drowsiness. ? Make important decisions or sign legal documents. ? Take care of children on your own. Eating and drinking  Follow any instructions from your health care provider about eating or drinking restrictions.  When you feel hungry, start by eating small amounts of foods that are soft and easy to digest (bland), such as toast. Gradually return to your regular diet.  Drink enough fluid to keep your urine pale yellow.  If you vomit, rehydrate by drinking water, juice, or clear broth. General instructions  If you have sleep apnea, surgery and certain medicines can increase your risk for breathing problems. Follow instructions from your health care provider about wearing your sleep device: ? Anytime you are sleeping, including during daytime naps. ? While taking prescription pain medicines, sleeping medicines, or medicines that make you drowsy.  Return to your normal activities as told by your health care provider. Ask your health care provider what activities are safe for you.  Take over-the-counter and prescription medicines only as told by your health care provider.  If you smoke, do not smoke without supervision.  Keep all follow-up visits as told by your health care provider. This is  important. Contact a health care provider if:  You have nausea or vomiting that does not get better with medicine.  You cannot eat or drink without vomiting.  You have pain that does not get better with medicine.  You are unable to pass urine.  You develop a skin rash.  You have a fever.  You have redness around your IV site that gets worse. Get help right away if:  You have difficulty breathing.  You have chest pain.  You have blood in your urine or stool, or you vomit blood. Summary  After the procedure, it is common to have a sore throat or nausea. It is also common to feel tired.  Have a  responsible adult stay with you for the first 24 hours after general anesthesia. It is important to have someone help care for you until you are awake and alert.  When you feel hungry, start by eating small amounts of foods that are soft and easy to digest (bland), such as toast. Gradually return to your regular diet.  Drink enough fluid to keep your urine pale yellow.  Return to your normal activities as told by your health care provider. Ask your health care provider what activities are safe for you. This information is not intended to replace advice given to you by your health care provider. Make sure you discuss any questions you have with your health care provider. Document Revised: 09/18/2017 Document Reviewed: 05/01/2017 Elsevier Patient Education  [place].

## 2020-03-09 NOTE — Anesthesia Procedure Notes (Signed)
Procedure Name: Intubation Performed by: Tacy Learn, CRNA Pre-anesthesia Checklist: Patient identified, Emergency Drugs available, Suction available, Patient being monitored and Timeout performed Patient Re-evaluated:Patient Re-evaluated prior to induction Oxygen Delivery Method: Circle system utilized Preoxygenation: Pre-oxygenation with 100% oxygen Induction Type: IV induction Ventilation: Mask ventilation without difficulty Laryngoscope Size: Miller and 2 Grade View: Grade I Tube type: Oral Number of attempts: 1 Airway Equipment and Method: Stylet Placement Confirmation: ETT inserted through vocal cords under direct vision,  positive ETCO2,  CO2 detector and breath sounds checked- equal and bilateral Secured at: 21 cm Tube secured with: Tape Dental Injury: Teeth and Oropharynx as per pre-operative assessment

## 2020-03-09 NOTE — Anesthesia Preprocedure Evaluation (Signed)
Anesthesia Evaluation  Patient identified by MRN, date of birth, ID band Patient awake    History of Anesthesia Complications (+) PONV and history of anesthetic complications  Airway Mallampati: II  TM Distance: >3 FB Neck ROM: Full    Dental  (+) Teeth Intact, Dental Advisory Given   Pulmonary former smoker,    Pulmonary exam normal breath sounds clear to auscultation       Cardiovascular Exercise Tolerance: Good Normal cardiovascular exam Rhythm:Regular Rate:Normal     Neuro/Psych  Headaches, PSYCHIATRIC DISORDERS Depression    GI/Hepatic negative GI ROS, Neg liver ROS,   Endo/Other  negative endocrine ROS  Renal/GU negative Renal ROS  negative genitourinary   Musculoskeletal negative musculoskeletal ROS (+)   Abdominal   Peds negative pediatric ROS (+)  Hematology negative hematology ROS (+)   Anesthesia Other Findings   Reproductive/Obstetrics negative OB ROS                            Anesthesia Physical Anesthesia Plan  ASA: II  Anesthesia Plan: General   Post-op Pain Management:    Induction: Intravenous  PONV Risk Score and Plan: 4 or greater and Ondansetron, Dexamethasone, Midazolam and Scopolamine patch - Pre-op  Airway Management Planned: Oral ETT  Additional Equipment:   Intra-op Plan:   Post-operative Plan: Extubation in OR  Informed Consent: I have reviewed the patients History and Physical, chart, labs and discussed the procedure including the risks, benefits and alternatives for the proposed anesthesia with the patient or authorized representative who has indicated his/her understanding and acceptance.     Dental advisory given  Plan Discussed with: CRNA and Surgeon  Anesthesia Plan Comments:        Anesthesia Quick Evaluation

## 2020-03-12 ENCOUNTER — Encounter (HOSPITAL_COMMUNITY): Payer: Self-pay | Admitting: General Surgery

## 2020-04-05 ENCOUNTER — Encounter: Payer: Self-pay | Admitting: General Surgery

## 2020-04-05 ENCOUNTER — Other Ambulatory Visit: Payer: Self-pay

## 2020-04-05 ENCOUNTER — Ambulatory Visit (INDEPENDENT_AMBULATORY_CARE_PROVIDER_SITE_OTHER): Payer: Self-pay | Admitting: General Surgery

## 2020-04-05 VITALS — BP 103/71 | HR 67 | Temp 96.6°F | Resp 16 | Ht 63.0 in | Wt 144.0 lb

## 2020-04-05 DIAGNOSIS — K439 Ventral hernia without obstruction or gangrene: Secondary | ICD-10-CM

## 2020-04-05 NOTE — Progress Notes (Signed)
[facility] Note   HPI:  54 y.o. Female presents to clinic for post-op follow-up evaluation after epigastric hernia repair. Doing well and feeling good.   Review of Systems:  No fever or chills Regular BM Tolerating diet All other review of systems: otherwise negative   Vital Signs:  BP 103/71   Pulse 67   Temp (!) 96.6 F (35.9 C) (Temporal)   Resp 16   Ht 5\' 3"  (1.6 m)   Wt 144 lb (65.3 kg)   SpO2 98%   BMI 25.51 kg/m    Physical Exam:  Physical Exam Vitals reviewed.  HENT:     Head: Normocephalic.  Pulmonary:     Effort: Pulmonary effort is normal.  Abdominal:     General: There is no distension.     Palpations: Abdomen is soft.     Tenderness: There is no abdominal tenderness.     Hernia: No hernia is present.     Comments: Healing epigastric incision, no erythema or drainage      Assessment:  54 y.o. yo Female s/p primary epigastric hernia repair. Doing well.  Plan:  - Avoid abdominal exercises until 8 weeks out   - No heavy lifting > 10 lbs, excessive bending, pushing, pulling, or squatting for 6-8 weeks after surgery.   PRN Follow up    Curlene Labrum, MD [facility] [address] [place], [place] [address] [phone] (office)

## 2020-04-30 ENCOUNTER — Other Ambulatory Visit (HOSPITAL_COMMUNITY): Payer: Self-pay | Admitting: Adult Health

## 2020-04-30 DIAGNOSIS — Z1231 Encounter for screening mammogram for malignant neoplasm of breast: Secondary | ICD-10-CM

## 2020-05-07 ENCOUNTER — Ambulatory Visit (HOSPITAL_COMMUNITY)
Admission: RE | Admit: 2020-05-07 | Discharge: 2020-05-07 | Disposition: A | Discharge status: Home / Self Care | Payer: BC Managed Care – PPO | Source: Ambulatory Visit | Attending: Adult Health | Admitting: Adult Health

## 2020-05-07 ENCOUNTER — Other Ambulatory Visit: Payer: Self-pay

## 2020-05-07 DIAGNOSIS — Z1231 Encounter for screening mammogram for malignant neoplasm of breast: Secondary | ICD-10-CM | POA: Insufficient documentation

## 2020-12-10 ENCOUNTER — Encounter: Payer: Self-pay | Admitting: Obstetrics & Gynecology

## 2020-12-10 ENCOUNTER — Ambulatory Visit: Payer: BC Managed Care – PPO | Admitting: Obstetrics & Gynecology

## 2020-12-10 ENCOUNTER — Other Ambulatory Visit: Payer: Self-pay

## 2020-12-10 VITALS — BP 111/73 | HR 65 | Ht 63.0 in | Wt 151.2 lb

## 2020-12-10 DIAGNOSIS — Z01411 Encounter for gynecological examination (general) (routine) with abnormal findings: Secondary | ICD-10-CM

## 2020-12-10 DIAGNOSIS — F419 Anxiety disorder, unspecified: Secondary | ICD-10-CM

## 2020-12-10 MED ORDER — BUSPIRONE HCL 5 MG PO TABS: 7.5000 mg | ORAL_TABLET | Freq: Two times a day (BID) | ORAL | Status: DC

## 2020-12-10 NOTE — Progress Notes (Addendum)
   WELL-WOMAN EXAMINATION Patient name: Sherri Salazar MRN [id number]  Date of birth: 1966-09-28 Chief Complaint:   Gynecologic Exam  History of Present Illness:   Sherri Salazar is a 55 y.o. G22P2 Caucasian female being seen today for a routine well-woman exam.   - Anxiety: long-standing issue and realizes this is of concern.  Felt that Paxil was making it worse not better.  Denies SI/HI.  She reports feeling overwhelmed often, often tearful  Depression screen [facility] 2/9 12/10/2020 01/03/2020 11/09/2019 11/09/2019  Decreased Interest 1 1 1 1   Down, Depressed, Hopeless 1 1 1 1   PHQ - 2 Score 2 2 2 2   Altered sleeping 2 0 0 -  Tired, decreased energy 3 1 1  -  Change in appetite 2 1 2  -  Feeling bad or failure about yourself  3 3 3  -  Trouble concentrating 2 0 2 -  Moving slowly or fidgety/restless 1 1 0 -  Suicidal thoughts 1 0 0 -  PHQ-9 Score 16 8 10  -    No LMP recorded. Patient has had an ablation. The current method of family planning is n/a.   Last pap 2021.  Last mammogram: 04/2020 neg. Last colonoscopy: not completed  Review of Systems:   Pertinent items are noted in HPI Denies any headaches, blurred vision, fatigue, shortness of breath, chest pain, abdominal pain, bowel movements, urination, or intercourse unless otherwise stated above.  Pertinent History Reviewed:  Reviewed past medical,surgical, social and family history.  Reviewed problem list, medications and allergies. Physical Assessment:   Vitals:   12/10/20 1548  BP: 111/73  Pulse: 65  Weight: 151 lb 3.2 oz (68.6 kg)  Height: 5\' 3"  (1.6 m)  Body mass index is 26.78 kg/m.        Physical Examination:   General appearance - well appearing, and in no distress  Mental status - alert, oriented to person, place, and time  Psych:  She has a normal mood and affect  Skin - warm and dry, normal color, no suspicious lesions noted  Chest - effort normal, all lung fields clear to auscultation bilaterally  Heart  - normal rate and regular rhythm  Neck:  midline trachea, no thyromegaly or nodules  Breasts - breasts appear normal, no suspicious masses, no skin or nipple changes or  axillary nodes  Abdomen - soft, nontender, nondistended, no masses or organomegaly  Pelvic - VULVA: normal appearing vulva with no masses, tenderness or lesions  VAGINA: normal appearing vagina with normal color and discharge, no lesions  CERVIX: normal appearing cervix without discharge or lesions, no CMT  UTERUS: uterus is felt to be normal size, shape, consistency and nontender   ADNEXA: No adnexal masses or tenderness noted.  Extremities:  No swelling or varicosities noted  Chaperone: Network engineer & Plan:  1) Well-Woman Exam Pap and mammogram up to date Encouraged pt to complete colonoscopy- plans to call GI  2) Anxiety -Rx sent in, f/u in 65mos -reviewed conservative therapy  Labs/procedures today: none   No orders of the defined types were placed in this encounter.   Meds:  Meds ordered this encounter  Medications  . busPIRone (BUSPAR) tablet 7.5 mg    Follow-up: Return in about 3 months (around 03/12/2021) for Medication follow up- in person or televisit.  [name]  12/10/2020 4:13 PM

## 2020-12-13 ENCOUNTER — Telehealth: Payer: Self-pay | Admitting: Internal Medicine

## 2020-12-13 ENCOUNTER — Other Ambulatory Visit: Payer: Self-pay | Admitting: Obstetrics & Gynecology

## 2020-12-13 DIAGNOSIS — F419 Anxiety disorder, unspecified: Secondary | ICD-10-CM

## 2020-12-13 MED ORDER — BUSPIRONE HCL 7.5 MG PO TABS: 7.5000 mg | ORAL_TABLET | Freq: Two times a day (BID) | ORAL | 3 refills | Status: AC

## 2020-12-13 NOTE — Telephone Encounter (Signed)
PT says meds were supposed to be called in at last appt, but pharmacy is saying nothing is available.

## 2020-12-13 NOTE — Progress Notes (Signed)
Initial order was placed incorrectly- resent in to pharmacy

## 2021-02-15 ENCOUNTER — Ambulatory Visit
Admission: EM | Admit: 2021-02-15 | Discharge: 2021-02-15 | Disposition: A | Discharge status: Home / Self Care | Payer: BC Managed Care – PPO | Attending: Emergency Medicine | Admitting: Emergency Medicine

## 2021-02-15 ENCOUNTER — Other Ambulatory Visit: Payer: Self-pay

## 2021-02-15 ENCOUNTER — Encounter: Payer: Self-pay | Admitting: Emergency Medicine

## 2021-02-15 DIAGNOSIS — R55 Syncope and collapse: Secondary | ICD-10-CM

## 2021-02-15 DIAGNOSIS — R6889 Other general symptoms and signs: Secondary | ICD-10-CM | POA: Diagnosis not present

## 2021-02-15 NOTE — Discharge Instructions (Signed)
Recommending further evaluation and management of syncopal episode in the ED.  Patient declines at this time and would like to try outpatient testing first.  Aware of the risk associated with this decision including missed diagnosis, organ damage, organ failure, and/or death.  Patient aware and in agreement.     COVID testing ordered.  It will take between 5-7 days for test results.  Someone will contact you regarding abnormal results.   In the meantime: You should remain isolated in your home for 10 days from symptom onset AND greater than 72 hours after symptoms resolution (absence of fever without the use of fever-reducing medication and improvement in respiratory symptoms), whichever is longer Get plenty of rest and push fluids Use OTC zyrtec for nasal congestion, runny nose, and/or sore throat Use OTC flonase for nasal congestion and runny nose Use medications daily for symptom relief Use OTC medications like ibuprofen or tylenol as needed fever or pain Follow up with PCP for further evaluation and management Call or go to the ED if you have any new or worsening symptoms such as fever, worsening cough, shortness of breath, chest tightness, chest pain, turning blue, changes in mental status, etc..Marland Kitchen

## 2021-02-15 NOTE — ED Triage Notes (Signed)
Low grade fever. Body aches.  Pt states that her skin hurts. fatigue and no energy. No appetite.  Did pass out about 3 weeks ago at a baseball game. Does not remember passing out.  EMS checked her out.  EKG completed.  Advised to go home that day. Rapid covid test at home have been negative.  Has been taking OTC dayquil and nyqil.

## 2021-02-15 NOTE — ED Provider Notes (Signed)
Sherri Salazar   [id number] 02/15/21 Arrival Time: 4166   CC: COVID symptoms  SUBJECTIVE: History from: patient.  Sherri Salazar is a 55 y.o. female who presents with low grade fever, body aches, fatigue, no energy, and cough x 3 weeks.  Denies sick exposure to COVID, flu or strep.  Denies alleviating or aggravating factors.  Denies previous symptoms in the past.   Denies SOB, wheezing, chest pain, nausea, changes in bowel or bladder habits.    Also reports an syncopal episode 3 weeks ago.  States she [place] and got to the [place] and passed out for apx 60 seconds.  Friend caught her and she did not hit her head.  EMS was called and evaluated her.  She was encouraged to go to the ED at that time.  Declined at that time.    Has appt with PCP on 5/24   ROS: As per HPI.  All other pertinent ROS negative.     Past Medical History:  Diagnosis Date  . Migraines   . PONV (postoperative nausea and vomiting)    Past Surgical History:  Procedure Laterality Date  . ABLATION    . CESAREAN SECTION  [id number]  . VENTRAL HERNIA REPAIR N/A 03/09/2020   Procedure: VENTRAL HERNIA REPAIR;  Surgeon: Virl Cagey, MD;  Location: [facility];  Service: General;  Laterality: N/A;   Allergies  Allergen Reactions  . Vicodin [Hydrocodone-Acetaminophen] Other (See Comments)    hallucinate   No current facility-administered medications on file prior to encounter.   Current Outpatient Medications on File Prior to Encounter  Medication Sig Dispense Refill  . Aspirin-Acetaminophen-Caffeine (EXCEDRIN MIGRAINE PO) Take 2 tablets by mouth 3 (three) times daily as needed (migraines).     . Multiple Vitamins-Minerals (EMERGEN-C IMMUNE PLUS/VIT D) CHEW Chew 2 tablets by mouth daily.     Social History   Socioeconomic History  . Marital status: Divorced    Spouse name: Not on file  . Number of children: Not on file  . Years of education: Not on file  .  Highest education level: Not on file  Occupational History  . Not on file  Tobacco Use  . Smoking status: Former Smoker    Types: Cigarettes    Quit date: 09/29/1985    Years since quitting: 35.4  . Smokeless tobacco: Never Used  . Tobacco comment: tried but not consistent  Vaping Use  . Vaping Use: Never used  Substance and Sexual Activity  . Alcohol use: No  . Drug use: No  . Sexual activity: Not Currently    Birth control/protection: Surgical  Other Topics Concern  . Not on file  Social History Narrative  . Not on file   Social Determinants of Health   Financial Resource Strain: Low Risk   . Difficulty of Paying Living Expenses: Not very hard  Food Insecurity: Food Insecurity Present  . Worried About Charity fundraiser in the Last Year: Sometimes true  . Ran Out of Food in the Last Year: Never true  Transportation Needs: No Transportation Needs  . Lack of Transportation (Medical): No  . Lack of Transportation (Non-Medical): No  Physical Activity: Sufficiently Active  . Days of Exercise per Week: 5 days  . Minutes of Exercise per Session: 60 min  Stress: Stress Concern Present  . Feeling of Stress : Very much  Social Connections: Moderately Isolated  . Frequency of Communication with Friends and Family: More  than three times a week  . Frequency of Social Gatherings with Friends and Family: Three times a week  . Attends Religious Services: More than 4 times per year  . Active Member of Clubs or Organizations: No  . Attends Archivist Meetings: Never  . Marital Status: Separated  Intimate Partner Violence: Not At Risk  . Fear of Current or Ex-Partner: No  . Emotionally Abused: No  . Physically Abused: No  . Sexually Abused: No   Family History  Problem Relation Age of Onset  . Parkinson's disease Father     OBJECTIVE:  Vitals:   02/15/21 1557 02/15/21 1558  BP: 124/80   Pulse: 77   Resp: 18   Temp: 98.3 F (36.8 C)   TempSrc: Oral   SpO2: 100%    Weight:  151 lb (68.5 kg)     General appearance: alert; well-appearing, nontoxic; speaking in full sentences and tolerating own secretions HEENT: NCAT; Ears: EACs clear, TMs pearly gray; Eyes: PERRL.  EOM grossly intact.Nose: nares patent without rhinorrhea, Throat: oropharynx clear, tonsils non erythematous or enlarged, uvula midline  Neck: supple without LAD Lungs: unlabored respirations, symmetrical air entry; cough: absent; no respiratory distress; CTAB Heart: regular rate and rhythm.  Skin: warm and dry Psychological: alert and cooperative; normal mood and affect   ASSESSMENT & PLAN:  1. Flu-like symptoms   2. Syncope, unspecified syncope type    Recommending further evaluation and management of syncopal episode in the ED.  Patient declines at this time and would like to try outpatient testing first.  Aware of the risk associated with this decision including missed diagnosis, organ damage, organ failure, and/or death.  Patient aware and in agreement.     COVID testing ordered.  It will take between 5-7 days for test results.  Someone will contact you regarding abnormal results.   In the meantime: You should remain isolated in your home for 10 days from symptom onset AND greater than 72 hours after symptoms resolution (absence of fever without the use of fever-reducing medication and improvement in respiratory symptoms), whichever is longer Get plenty of rest and push fluids Use OTC zyrtec for nasal congestion, runny nose, and/or sore throat Use OTC flonase for nasal congestion and runny nose Use medications daily for symptom relief Use OTC medications like ibuprofen or tylenol as needed fever or pain Follow up with PCP for further evaluation and management Call or go to the ED if you have any new or worsening symptoms such as fever, worsening cough, shortness of breath, chest tightness, chest pain, turning blue, changes in mental status, etc...   Reviewed expectations re: course  of current medical issues. Questions answered. Outlined signs and symptoms indicating need for more acute intervention. Patient verbalized understanding. After Visit Summary given.         Lestine Box, PA-C 02/15/21 1639

## 2021-02-16 LAB — COVID-19, FLU A+B NAA
Influenza A, NAA: NOT DETECTED
Influenza B, NAA: NOT DETECTED
SARS-CoV-2, NAA: NOT DETECTED

## 2021-02-19 ENCOUNTER — Other Ambulatory Visit: Payer: Self-pay

## 2021-02-19 ENCOUNTER — Encounter (INDEPENDENT_AMBULATORY_CARE_PROVIDER_SITE_OTHER): Payer: Self-pay | Admitting: Internal Medicine

## 2021-02-19 ENCOUNTER — Ambulatory Visit (INDEPENDENT_AMBULATORY_CARE_PROVIDER_SITE_OTHER): Payer: BC Managed Care – PPO | Admitting: Internal Medicine

## 2021-02-19 VITALS — BP 130/70 | HR 63 | Temp 97.9°F | Resp 18 | Ht 62.0 in | Wt 152.0 lb

## 2021-02-19 DIAGNOSIS — E559 Vitamin D deficiency, unspecified: Secondary | ICD-10-CM | POA: Diagnosis not present

## 2021-02-19 DIAGNOSIS — R5381 Other malaise: Secondary | ICD-10-CM | POA: Diagnosis not present

## 2021-02-19 DIAGNOSIS — Z131 Encounter for screening for diabetes mellitus: Secondary | ICD-10-CM

## 2021-02-19 DIAGNOSIS — F520 Hypoactive sexual desire disorder: Secondary | ICD-10-CM

## 2021-02-19 DIAGNOSIS — N951 Menopausal and female climacteric states: Secondary | ICD-10-CM

## 2021-02-19 DIAGNOSIS — Z13220 Encounter for screening for lipoid disorders: Secondary | ICD-10-CM

## 2021-02-19 DIAGNOSIS — R5383 Other fatigue: Secondary | ICD-10-CM

## 2021-02-19 MED ORDER — PROGESTERONE 200 MG PO CAPS
200.0000 mg | ORAL_CAPSULE | Freq: Every day | ORAL | 3 refills | Status: DC
Start: 2021-02-19 — End: 2021-07-31

## 2021-02-19 MED ORDER — ESTRADIOL 1 MG PO TABS
1.0000 mg | ORAL_TABLET | Freq: Every day | ORAL | 3 refills | Status: DC
Start: 2021-02-19 — End: 2021-07-31

## 2021-02-19 NOTE — Progress Notes (Signed)
Metrics: Intervention Frequency ACO  Documented Smoking Status Yearly  Screened one or more times in 24 months  Cessation Counseling or  Active cessation medication Past 24 months  Past 24 months   Guideline developer: UpToDate (See UpToDate for funding source) Date Released: 2014       Wellness Office Visit  Subjective:  Patient ID: Sherri Salazar, female    DOB: [birth year]  Age: 55 y.o. MRN: [id number]  CC: This pleasant 55 year old lady comes to our practice as a new patient to establish care. HPI  She describes fatigue constantly, she says that by the time 3 PM comes around, she is very tired.  She also describes hot flashes, insomnia, decreased libido for several years. She recently went through divorce after being married for 21 years. She would like to feel healthier. She has been taking Estroven supplement over-the-counter to see if this will help her. Past Medical History:  Diagnosis Date  . Migraines   . PONV (postoperative nausea and vomiting)    Past Surgical History:  Procedure Laterality Date  . ABLATION    . CESAREAN SECTION  [id number]  . VENTRAL HERNIA REPAIR N/A 03/09/2020   Procedure: VENTRAL HERNIA REPAIR;  Surgeon: Virl Cagey, MD;  Location: [facility];  Service: General;  Laterality: N/A;     Family History  Problem Relation Age of Onset  . 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   Social History   Tobacco Use  . Smoking status: Former Smoker    Types: Cigarettes    Quit date: 09/29/1985    Years since quitting: 35.4  . Smokeless tobacco: Never Used  . Tobacco comment: tried but not consistent  Substance Use Topics  . Alcohol use: No    Current Meds  Medication Sig  . Aspirin-Acetaminophen-Caffeine (EXCEDRIN MIGRAINE PO) Take 2 tablets by mouth 3 (three) times daily as needed (migraines).   Marland Kitchen estradiol  (ESTRACE) 1 MG tablet Take 1 tablet (1 mg total) by mouth daily.  . Misc Natural Products (ESTROVEN + ENERGY MAX STRENGTH PO) Take 1 capsule by mouth daily.  . Multiple Vitamins-Minerals (EMERGEN-C IMMUNE PLUS/VIT D) CHEW Chew 2 tablets by mouth daily.  . Multiple Vitamins-Minerals (WOMENS MULTI VITAMIN & MINERAL PO) Take 1 tablet by mouth daily.  . progesterone (PROMETRIUM) 200 MG capsule Take 1 capsule (200 mg total) by mouth daily.     [place] Office Visit from 12/10/2020 in [facility]  PHQ-9 Total Score 16      Objective:   Today's Vitals: BP 130/70 (BP Location: Left Arm, Patient Position: Sitting, Cuff Size: Normal)   Pulse 63   Temp 97.9 F (36.6 C) (Temporal)   Resp 18   Ht 5\' 2"  (1.575 m)   Wt 152 lb (68.9 kg)   SpO2 98%   BMI 27.80 kg/m  Vitals with BMI 02/19/2021 02/15/2021 12/10/2020  Height 5\' 2"  - 5\' 3"   Weight 152 lbs 151 lbs 151 lbs 3 oz  BMI 99.35 - 70.17  Systolic 793 903 009  Diastolic 70 80 73  Pulse 63 77 65     Physical Exam   She looks systemically well.  Blood pressure in a good range.  Slightly overweight.    Assessment   1. Hot flashes due to menopause   2. Malaise and fatigue   3. Vitamin D deficiency disease  4. Hypoactive sexual desire disorder   5. Screening for lipoid disorders   6. Screening for diabetes mellitus       Tests ordered Orders Placed This Encounter  Procedures  . DHEA-sulfate  . Estradiol  . Progesterone  . T3, free  . T4, free  . TSH  . Testos,Total,Free and SHBG (Female)  . CBC  . COMPLETE METABOLIC PANEL WITH GFR  . Hemoglobin A1c  . Lipid panel  . VITAMIN D 25 Hydroxy (Vit-D Deficiency, Fractures)     Plan: 1. Blood work is ordered. 2. After full discussion regarding bioidentical hormone therapy versus synthetic hormones, she is agreeable to start on estradiol and progesterone to help her symptoms of menopause.  I have discussed the benefits and side effects with her and she is  agreeable. 3. I will see her in few weeks time to see how she is doing and discuss blood results and further recommendations.   Meds ordered this encounter  Medications  . estradiol (ESTRACE) 1 MG tablet    Sig: Take 1 tablet (1 mg total) by mouth daily.    Dispense:  30 tablet    Refill:  3  . progesterone (PROMETRIUM) 200 MG capsule    Sig: Take 1 capsule (200 mg total) by mouth daily.    Dispense:  30 capsule    Refill:  3    Chevella Pearce Luther Parody, MD

## 2021-02-23 LAB — COMPLETE METABOLIC PANEL WITH GFR
AG Ratio: 1.5 (calc) (ref 1.0–2.5)
ALT: 15 U/L (ref 6–29)
AST: 24 U/L (ref 10–35)
Albumin: 4.4 g/dL (ref 3.6–5.1)
Alkaline phosphatase (APISO): 55 U/L (ref 37–153)
BUN/Creatinine Ratio: 29 (calc) — ABNORMAL HIGH (ref 6–22)
BUN: 27 mg/dL — ABNORMAL HIGH (ref 7–25)
CO2: 29 mmol/L (ref 20–32)
Calcium: 9.6 mg/dL (ref 8.6–10.4)
Chloride: 105 mmol/L (ref 98–110)
Creat: 0.93 mg/dL (ref 0.50–1.05)
GFR, Est African American: 81 mL/min/{1.73_m2} (ref >=60)
GFR, Est Non African American: 70 mL/min/{1.73_m2} (ref >=60)
Globulin: 2.9 g/dL (calc) (ref 1.9–3.7)
Glucose, Bld: 71 mg/dL (ref 65–139)
Potassium: 3.9 mmol/L (ref 3.5–5.3)
Sodium: 141 mmol/L (ref 135–146)
Total Bilirubin: 0.3 mg/dL (ref 0.2–1.2)
Total Protein: 7.3 g/dL (ref 6.1–8.1)

## 2021-02-23 LAB — CBC
HCT: 33.4 % — ABNORMAL LOW (ref 35.0–45.0)
Hemoglobin: 11.4 g/dL — ABNORMAL LOW (ref 11.7–15.5)
MCH: 29.7 pg (ref 27.0–33.0)
MCHC: 34.1 g/dL (ref 32.0–36.0)
MCV: 87 fL (ref 80.0–100.0)
MPV: 10.1 fL (ref 7.5–12.5)
Platelets: 291 10*3/uL (ref 140–400)
RBC: 3.84 10*6/uL (ref 3.80–5.10)
RDW: 12.9 % (ref 11.0–15.0)
WBC: 5.3 10*3/uL (ref 3.8–10.8)

## 2021-02-23 LAB — LIPID PANEL
Cholesterol: 148 mg/dL (ref <200)
HDL: 66 mg/dL (ref >=50)
LDL Cholesterol (Calc): 65 mg/dL (calc)
Non-HDL Cholesterol (Calc): 82 mg/dL (calc) (ref <130)
Total CHOL/HDL Ratio: 2.2 (calc) (ref <5.0)
Triglycerides: 89 mg/dL (ref <150)

## 2021-02-23 LAB — DHEA-SULFATE: DHEA-SO4: 94 ug/dL (ref 5–167)

## 2021-02-23 LAB — PROGESTERONE: Progesterone: <0.5 ng/mL

## 2021-02-23 LAB — TESTOS,TOTAL,FREE AND SHBG (FEMALE)
Free Testosterone: 0.7 pg/mL (ref 0.1–6.4)
Sex Hormone Binding: 78 nmol/L (ref 17–124)
Testosterone, Total, LC-MS-MS: 9 ng/dL (ref 2–45)

## 2021-02-23 LAB — HEMOGLOBIN A1C
Hgb A1c MFr Bld: 5.4 % of total Hgb (ref <5.7)
Mean Plasma Glucose: 108 mg/dL
eAG (mmol/L): 6 mmol/L

## 2021-02-23 LAB — ESTRADIOL: Estradiol: <15 pg/mL

## 2021-02-23 LAB — TSH: TSH: 1.96 mIU/L

## 2021-02-23 LAB — T3, FREE: T3, Free: 3 pg/mL (ref 2.3–4.2)

## 2021-02-23 LAB — VITAMIN D 25 HYDROXY (VIT D DEFICIENCY, FRACTURES): Vit D, 25-Hydroxy: 28 ng/mL — ABNORMAL LOW (ref 30–100)

## 2021-02-23 LAB — T4, FREE: Free T4: 1.1 ng/dL (ref 0.8–1.8)

## 2021-03-07 ENCOUNTER — Telehealth: Payer: BC Managed Care – PPO | Admitting: Adult Health

## 2021-03-26 ENCOUNTER — Other Ambulatory Visit: Payer: Self-pay

## 2021-03-26 ENCOUNTER — Encounter (INDEPENDENT_AMBULATORY_CARE_PROVIDER_SITE_OTHER): Payer: Self-pay | Admitting: Internal Medicine

## 2021-03-26 ENCOUNTER — Ambulatory Visit (INDEPENDENT_AMBULATORY_CARE_PROVIDER_SITE_OTHER): Payer: BC Managed Care – PPO | Admitting: Internal Medicine

## 2021-03-26 VITALS — BP 110/67 | HR 66 | Temp 97.5°F | Resp 18 | Ht 62.0 in | Wt 156.0 lb

## 2021-03-26 DIAGNOSIS — R5381 Other malaise: Secondary | ICD-10-CM

## 2021-03-26 DIAGNOSIS — F520 Hypoactive sexual desire disorder: Secondary | ICD-10-CM | POA: Diagnosis not present

## 2021-03-26 DIAGNOSIS — E559 Vitamin D deficiency, unspecified: Secondary | ICD-10-CM | POA: Diagnosis not present

## 2021-03-26 DIAGNOSIS — D649 Anemia, unspecified: Secondary | ICD-10-CM

## 2021-03-26 DIAGNOSIS — N951 Menopausal and female climacteric states: Secondary | ICD-10-CM

## 2021-03-26 DIAGNOSIS — R5383 Other fatigue: Secondary | ICD-10-CM

## 2021-03-26 NOTE — Patient Instructions (Signed)
Sherri Salazar [facility] Dietary Recommendations for Weight Loss What to Avoid Avoid added sugars Often added sugar can be found in processed foods such as many condiments, dry cereals, cakes, cookies, chips, crisps, crackers, candies, sweetened drinks, etc.  Read labels and AVOID/DECREASE use of foods with the following in their ingredient list: Sugar, fructose, high fructose corn syrup, sucrose, glucose, maltose, dextrose, molasses, cane sugar, brown sugar, any type of syrup, agave nectar, etc.   Avoid snacking in between meals Avoid foods made with flour If you are going to eat food made with flour, choose those made with whole-grains; and, minimize your consumption as much as is tolerable Avoid processed foods These foods are generally stocked in the middle of the grocery store. Focus on shopping on the perimeter of the grocery.  Avoid Meat  We recommend following a plant-based diet at [facility]. Thus, we recommend avoiding meat as a general rule. Consider eating beans, legumes, eggs, and/or dairy products for regular protein sources If you plan on eating meat limit to 4 ounces of meat at a time and choose lean options such as Fish, chicken, [place]. Avoid red meat intake such as pork and/or steak What to Include Vegetables GREEN LEAFY VEGETABLES: Kale, spinach, mustard greens, collard greens, cabbage, broccoli, etc. OTHER: Asparagus, cauliflower, eggplant, carrots, peas, Brussel sprouts, tomatoes, bell peppers, zucchini, beets, cucumbers, etc. Grains, seeds, and legumes Beans: kidney beans, black eyed peas, garbanzo beans, black beans, pinto beans, etc. Whole, unrefined grains: brown rice, barley, bulgur, oatmeal, etc. Healthy fats  Avoid highly processed fats such as vegetable oil Examples of healthy fats: avocado, olives, virgin olive oil, dark chocolate (?72% Cocoa), nuts (peanuts, almonds, walnuts, cashews, pecans, etc.) None to Low Intake of Animal Sources of Protein Meat  sources: chicken, [place], salmon, tuna. Limit to 4 ounces of meat at one time. Consider limiting dairy sources, but when choosing dairy focus on: PLAIN [place] yogurt, cottage cheese, high-protein milk Fruit Choose berries  When to Eat Intermittent Fasting: Choosing not to eat for a specific time period, but DO FOCUS ON HYDRATION when fasting Multiple Techniques: Time Restricted Eating: eat 3 meals in a day, each meal lasting no more than 60 minutes, no snacks between meals 16-18 hour fast: fast for 16 to 18 hours up to 7 days a week. Often suggested to start with 2-3 nonconsecutive days per week.  Remember the time you sleep is counted as fasting.  Examples of eating schedule: Fast from 7:00pm-11:00am. Eat between 11:00am-7:00pm.  24-hour fast: fast for 24 hours up to every other day. Often suggested to start with 1 day per week Remember the time you sleep is counted as fasting Examples of eating schedule:  Eating day: eat 2-3 meals on your eating day. If doing 2 meals, each meal should last no more than 90 minutes. If doing 3 meals, each meal should last no more than 60 minutes. Finish last meal by 7:00pm. Fasting day: Fast until 7:00pm.  IF YOU FEEL UNWELL FOR ANY REASON/IN ANY WAY WHEN FASTING, STOP FASTING BY EATING A NUTRITIOUS SNACK OR LIGHT MEAL ALWAYS FOCUS ON HYDRATION DURING FASTS Acceptable Hydration sources: water, broths, tea/coffee (black tea/coffee is best but using a small amount of whole-fat dairy products in coffee/tea is acceptable).  Poor Hydration Sources: anything with sugar or artificial sweeteners added to it  These recommendations have been developed for patients that are actively receiving medical care from either Dr. Anastasio Champion or Jeralyn Ruths, DNP, NP-C at [facility]. These recommendations  are developed for patients with specific medical conditions and are not meant to be distributed or used by others that are not actively receiving care from either provider  listed above at [facility]. It is not appropriate to participate in the above eating plans without proper medical supervision.   Reference: Rexanne Mano. The obesity code. [place]; 2016.

## 2021-03-26 NOTE — Progress Notes (Signed)
Metrics: Intervention Frequency ACO  Documented Smoking Status Yearly  Screened one or more times in 24 months  Cessation Counseling or  Active cessation medication Past 24 months  Past 24 months   Guideline developer: UpToDate (See UpToDate for funding source) Date Released: 2014       Wellness Office Visit  Subjective:  Patient ID: Sherri Salazar, female    DOB: May 11, 1966  Age: 55 y.o. MRN: [id number]  CC: This lady comes in for follow-up regarding her blood work and further recommendations. HPI  Since starting estradiol and progesterone, her hot flashes are resolved and her sense of fatigue and dragging feeling seems to be improving. I discussed all her blood work with her.  She is vitamin D deficient.  As expected of course estradiol and progesterone levels are nonexistent and testosterone levels are suboptimal.  DHEA levels are also suboptimal. She is anemic which is normocytic.  She has been told this previously but not been investigated She is struggling with losing weight.  She told me what she normally eats every day. 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   Social History   Tobacco Use   Smoking status: Former    Pack years: 0.00    Types: Cigarettes    Quit date: 09/29/1985    Years since quitting: 35.5   Smokeless tobacco: Never   Tobacco comments:    tried but not consistent  Substance Use Topics   Alcohol use: No     Current Meds  Medication Sig   Aspirin-Acetaminophen-Caffeine (EXCEDRIN MIGRAINE PO) Take 2 tablets by mouth 3 (three) times daily as needed (migraines).    estradiol (ESTRACE) 1 MG tablet Take 1 tablet (1 mg total) by mouth daily.   Multiple Vitamins-Minerals (EMERGEN-C IMMUNE PLUS/VIT D) CHEW Chew 2 tablets by mouth daily.   Multiple Vitamins-Minerals (WOMENS MULTI VITAMIN & MINERAL PO) Take 1 tablet by mouth daily.   progesterone (PROMETRIUM) 200 MG capsule Take 1 capsule (200 mg total) by mouth daily.     [place] Office Visit from 12/10/2020 in [facility]  PHQ-9 Total Score 16       Objective:   Today's Vitals: BP 110/67 (BP Location: Right Arm, Patient Position: Sitting, Cuff Size: Normal)   Pulse 66   Temp (!) 97.5 F (36.4 C) (Temporal)   Resp 18   Ht 5\' 2"  (1.575 m)   Wt 156 lb (70.8 kg)   SpO2 98%   BMI 28.53 kg/m  Vitals with BMI 03/26/2021 02/19/2021 02/15/2021  Height 5\' 2"  5\' 2"  -  Weight 156 lbs 152 lbs 151 lbs  BMI 17.40 81.44 -  Systolic 818 563 149  Diastolic 67 70 80  Pulse 66 63 77     Physical Exam She looks systemically well, overweight.  Blood pressure is excellent.      Assessment   1. Hot flashes due to menopause  2. Hypoactive sexual desire disorder   3. Vitamin D deficiency disease   4. Anemia, unspecified type   5. Malaise and fatigue       Tests ordered Orders Placed This Encounter  Procedures   Estradiol   Progesterone   Ferritin   Iron, Total/Total Iron Binding Cap   B12 and Folate Panel   Ambulatory referral to Gastroenterology      Plan: 1.  I will do further blood work to investigate her anemia but also she does need a colonoscopy and I will refer to gastroenterology. 2.  Continue with the current dose of estradiol and progesterone and we will check levels. 3.  We discussed nutrition today and the concept of intermittent fasting combined with a plant-based diet.  I have given her diet  sheet. 4.  Follow-up in about 6 weeks to see how she is doing.  I spent 30 minutes with this patient discussing her results in detail, discussing nutrition also.    No orders of the defined types were placed in this encounter.   Doree Albee, MD

## 2021-03-26 NOTE — Progress Notes (Signed)
Not as tired and energy is improved. She said she is drinking the 1gal of water almost 2x day. She is upset with changes she is making her weight is not going down yet. She is counting her points daily on meals.

## 2021-03-27 LAB — ESTRADIOL: Estradiol: 28 pg/mL

## 2021-03-27 LAB — IRON, TOTAL/TOTAL IRON BINDING CAP
%SAT: 27 % (calc) (ref 16–45)
Iron: 81 ug/dL (ref 45–160)
TIBC: 296 mcg/dL (calc) (ref 250–450)

## 2021-03-27 LAB — B12 AND FOLATE PANEL
Folate: >24 ng/mL
Vitamin B-12: 433 pg/mL (ref 200–1100)

## 2021-03-27 LAB — FERRITIN: Ferritin: 16 ng/mL (ref 16–232)

## 2021-03-27 LAB — PROGESTERONE: Progesterone: 12.1 ng/mL

## 2021-03-28 ENCOUNTER — Encounter: Payer: Self-pay | Admitting: Internal Medicine

## 2021-05-07 ENCOUNTER — Other Ambulatory Visit (HOSPITAL_COMMUNITY): Payer: Self-pay | Admitting: Adult Health

## 2021-05-07 DIAGNOSIS — Z1231 Encounter for screening mammogram for malignant neoplasm of breast: Secondary | ICD-10-CM

## 2021-05-09 ENCOUNTER — Other Ambulatory Visit: Payer: Self-pay

## 2021-05-09 ENCOUNTER — Ambulatory Visit (HOSPITAL_COMMUNITY)
Admission: RE | Admit: 2021-05-09 | Discharge: 2021-05-09 | Disposition: A | Discharge status: Home / Self Care | Payer: BC Managed Care – PPO | Source: Ambulatory Visit | Attending: Adult Health | Admitting: Adult Health

## 2021-05-09 DIAGNOSIS — Z1231 Encounter for screening mammogram for malignant neoplasm of breast: Secondary | ICD-10-CM | POA: Diagnosis not present

## 2021-05-22 ENCOUNTER — Ambulatory Visit (INDEPENDENT_AMBULATORY_CARE_PROVIDER_SITE_OTHER): Payer: BC Managed Care – PPO | Admitting: Internal Medicine

## 2021-06-01 ENCOUNTER — Other Ambulatory Visit: Payer: Self-pay

## 2021-06-01 ENCOUNTER — Encounter (HOSPITAL_COMMUNITY): Payer: Self-pay | Admitting: *Deleted

## 2021-06-01 ENCOUNTER — Emergency Department (HOSPITAL_COMMUNITY)
Admission: EM | Admit: 2021-06-01 | Discharge: 2021-06-01 | Disposition: A | Discharge status: Home / Self Care | Payer: BC Managed Care – PPO | Attending: Emergency Medicine | Admitting: Emergency Medicine

## 2021-06-01 DIAGNOSIS — G43909 Migraine, unspecified, not intractable, without status migrainosus: Secondary | ICD-10-CM | POA: Diagnosis not present

## 2021-06-01 DIAGNOSIS — Z5321 Procedure and treatment not carried out due to patient leaving prior to being seen by health care provider: Secondary | ICD-10-CM | POA: Diagnosis not present

## 2021-06-01 DIAGNOSIS — R519 Headache, unspecified: Secondary | ICD-10-CM | POA: Diagnosis present

## 2021-06-01 NOTE — ED Notes (Signed)
Reported that pt left at 2226

## 2021-06-01 NOTE — ED Triage Notes (Signed)
Pt with had a bug fly into her right eye this morning while walking, pt able to manage to get bug mostly out but feels like something still in there.  Pt then developed a migraine HA with emesis  x6 today.

## 2021-07-16 ENCOUNTER — Ambulatory Visit: Payer: BC Managed Care – PPO | Admitting: Gastroenterology

## 2021-07-31 ENCOUNTER — Other Ambulatory Visit: Payer: Self-pay

## 2021-07-31 ENCOUNTER — Ambulatory Visit: Payer: BC Managed Care – PPO | Admitting: Gastroenterology

## 2021-07-31 ENCOUNTER — Encounter: Payer: Self-pay | Admitting: Gastroenterology

## 2021-07-31 VITALS — BP 113/78 | HR 79 | Temp 97.8°F | Ht 62.0 in | Wt 155.2 lb

## 2021-07-31 DIAGNOSIS — D508 Other iron deficiency anemias: Secondary | ICD-10-CM | POA: Diagnosis not present

## 2021-07-31 NOTE — Patient Instructions (Signed)
Colonoscopy with possible upper endoscopy to evaluate anemia/iron deficiency.  Please update your labs within the next 1-2 weeks.

## 2021-07-31 NOTE — Progress Notes (Signed)
Primary Care Physician:  Pcp, No  Primary Gastroenterologist:  Elon Alas. Abbey Chatters, DO   Chief Complaint  Patient presents with   Anemia    Never had tcs    HPI:  Sherri Salazar is a 55 y.o. female here at the request of former PCP, Dr. Anastasio Champion, for further evaluation of anemia.   Labs from June 2022: Ferritin 16 (reference range 16-232), iron 81, TIBC 296, iron saturation is 27%, B12 433, folate greater than 24.  Patient denies any vaginal blood loss, nosebleeds, hematuria, rectal bleeding, melena.  She notes that she eats limited red meat, only once or twice per month.  She is concerned she may not get adequate iron sources from her diet.  Bowel movements are regular.  No abdominal pain, heartburn, dysphagia, vomiting.  Excedrin migraine medication twice a month currently but she has been concerned about excessive use in the past.   Current Outpatient Medications  Medication Sig Dispense Refill   Aspirin-Acetaminophen-Caffeine (EXCEDRIN MIGRAINE PO) Take 2 tablets by mouth 3 (three) times daily as needed (migraines).      Multiple Vitamins-Minerals (EMERGEN-C IMMUNE PLUS/VIT D) CHEW Chew 2 tablets by mouth daily.     Multiple Vitamins-Minerals (WOMENS MULTI VITAMIN & MINERAL PO) Take 1 tablet by mouth daily.     No current facility-administered medications for this visit.    Allergies as of 07/31/2021 - Review Complete 07/31/2021  Allergen Reaction Noted   Lexapro [escitalopram] Other (See Comments) 02/19/2021   Prozac [fluoxetine] Other (See Comments) 02/19/2021   Zoloft [sertraline hcl] Other (See Comments) 02/19/2021   Wellbutrin [bupropion] Hives and Rash 02/19/2021   Vicodin [hydrocodone-acetaminophen] Other (See Comments) 11/09/2019    Past Medical History:  Diagnosis Date   Migraines    PONV (postoperative nausea and vomiting)     Past Surgical History:  Procedure Laterality Date   ABLATION     CESAREAN SECTION  2000,2002   VENTRAL HERNIA REPAIR N/A 03/09/2020    Procedure: VENTRAL HERNIA REPAIR;  Surgeon: Virl Cagey, MD;  Location: [facility];  Service: General;  Laterality: N/A;    Family History  Problem Relation Age of Onset   Parkinson's disease Father    Colon cancer Neg Hx     Social History   Socioeconomic History   Marital status: Divorced    Spouse name: Not on file   Number of children: Not on file   Years of education: Not on file   Highest education level: Not on file  Occupational History   Not on file  Tobacco Use   Smoking status: Former    Types: Cigarettes    Quit date: 09/29/1985    Years since quitting: 35.8   Smokeless tobacco: Never   Tobacco comments:    tried but not consistent  Vaping Use   Vaping Use: Never used  Substance and Sexual Activity   Alcohol use: No   Drug use: No   Sexual activity: Not Currently    Birth control/protection: Surgical  Other Topics Concern   Not on file  Social History Narrative   Divorced since April 2022,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   Social Determinants of Health   Financial Resource Strain: Low Risk    Difficulty of Paying Living Expenses: Not very hard  Food Insecurity: Food Insecurity Present   Worried About Running Out of Food in the Last Year: Sometimes true   Ran Out of Food in the Last Year: Never true  Transportation Needs: No  Transportation Needs   Lack of Transportation (Medical): No   Lack of Transportation (Non-Medical): No  Physical Activity: Sufficiently Active   Days of Exercise per Week: 5 days   Minutes of Exercise per Session: 60 min  Stress: Stress Concern Present   Feeling of Stress : Very much  Social Connections: Moderately Isolated   Frequency of Communication with Friends and Family: More than three times a week   Frequency of Social Gatherings with Friends and Family: Three times a week   Attends Religious Services: More than 4 times per year   Active Member of Clubs or Organizations: No   Attends  Archivist Meetings: Never   Marital Status: Separated  Intimate Partner Violence: Not At Risk   Fear of Current or Ex-Partner: No   Emotionally Abused: No   Physically Abused: No   Sexually Abused: No      ROS:  General: Negative for anorexia, weight loss, fever, chills, fatigue, weakness. Eyes: Negative for vision changes.  ENT: Negative for hoarseness, difficulty swallowing , nasal congestion. CV: Negative for chest pain, angina, palpitations, dyspnea on exertion, peripheral edema.  Respiratory: Negative for dyspnea at rest, dyspnea on exertion, cough, sputum, wheezing.  GI: See history of present illness. GU:  Negative for dysuria, hematuria, urinary incontinence, urinary frequency, nocturnal urination.  MS: Negative for joint pain, low back pain.  Derm: Negative for rash or itching.  Neuro: Negative for weakness, abnormal sensation, seizure, frequent headaches, memory loss, confusion.  Psych: Negative for anxiety, depression, suicidal ideation, hallucinations.  Endo: Negative for unusual weight change.  Heme: Negative for bruising or bleeding. Allergy: Negative for rash or hives.    Physical Examination:  BP 113/78   Pulse 79   Temp 97.8 F (36.6 C) (Temporal)   Ht 5\' 2"  (1.575 m)   Wt 155 lb 3.2 oz (70.4 kg)   BMI 28.39 kg/m    General: Well-nourished, well-developed in no acute distress.  Head: Normocephalic, atraumatic.   Eyes: Conjunctiva pink, no icterus. Mouth: masked Neck: Supple without thyromegaly, masses, or lymphadenopathy.  Lungs: Clear to auscultation bilaterally.  Heart: Regular rate and rhythm, no murmurs rubs or gallops.  Abdomen: Bowel sounds are normal, nontender, nondistended, no hepatosplenomegaly or masses, no abdominal bruits or    hernia , no rebound or guarding.   Rectal: not performed Extremities: No lower extremity edema. No clubbing or deformities.  Neuro: Alert and oriented x 4 , grossly normal neurologically.  Skin: Warm  and dry, no rash or jaundice.   Psych: Alert and cooperative, normal mood and affect.  Labs: Lab Results  Component Value Date   RJJOACZY60 630 03/26/2021   Lab Results  Component Value Date   FOLATE >24.0 03/26/2021   Lab Results  Component Value Date   IRON 81 03/26/2021   TIBC 296 03/26/2021   FERRITIN 16 03/26/2021   Lab Results  Component Value Date   HGBA1C 5.4 02/19/2021   Lab Results  Component Value Date   CREATININE 0.93 02/19/2021   BUN 27 (H) 02/19/2021   NA 141 02/19/2021   K 3.9 02/19/2021   CL 105 02/19/2021   CO2 29 02/19/2021   Lab Results  Component Value Date   WBC 5.3 02/19/2021   HGB 11.4 (L) 02/19/2021   HCT 33.4 (L) 02/19/2021   MCV 87.0 02/19/2021   PLT 291 02/19/2021     Imaging Studies: No results found.   Assessment:  Very pleasant 55 year old female presenting for further evaluation of  anemia.  No prior colonoscopy.  No family history of colon cancer.  Her serum iron is normal, ferritin is low normal at 16.  Suspect trend towards iron deficiency.  No overt blood losses.  She reports minimal dietary iron intake.  Given labs are 48 months old, we will update labs.  Recommend colonoscopy.  Possible upper endoscopy at same time especially if persistent anemia based on upcoming labs in light of ongoing aspirin use.    Plan:  CBC, iron/TIBC/ferritin. Colonoscopy with possible upper endoscopy with Dr. Abbey Chatters.  ASA 1.  I have discussed the risks, alternatives, benefits with regards to but not limited to the risk of reaction to medication, bleeding, infection, perforation and the patient is agreeable to proceed. Written consent to be obtained.

## 2021-08-03 LAB — CBC WITH DIFFERENTIAL/PLATELET
Absolute Monocytes: 516 cells/uL (ref 200–950)
Basophils Absolute: 40 cells/uL (ref 0–200)
Basophils Relative: 0.6 %
Eosinophils Absolute: 80 cells/uL (ref 15–500)
Eosinophils Relative: 1.2 %
HCT: 33.7 % — ABNORMAL LOW (ref 35.0–45.0)
Hemoglobin: 11.5 g/dL — ABNORMAL LOW (ref 11.7–15.5)
Lymphs Abs: 1843 cells/uL (ref 850–3900)
MCH: 29.6 pg (ref 27.0–33.0)
MCHC: 34.1 g/dL (ref 32.0–36.0)
MCV: 86.9 fL (ref 80.0–100.0)
MPV: 10.1 fL (ref 7.5–12.5)
Monocytes Relative: 7.7 %
Neutro Abs: 4221 cells/uL (ref 1500–7800)
Neutrophils Relative %: 63 %
Platelets: 306 10*3/uL (ref 140–400)
RBC: 3.88 10*6/uL (ref 3.80–5.10)
RDW: 12.5 % (ref 11.0–15.0)
Total Lymphocyte: 27.5 %
WBC: 6.7 10*3/uL (ref 3.8–10.8)

## 2021-08-03 LAB — IRON,TIBC AND FERRITIN PANEL
%SAT: 17 % (calc) (ref 16–45)
Ferritin: 20 ng/mL (ref 16–232)
Iron: 54 ug/dL (ref 45–160)
TIBC: 323 mcg/dL (calc) (ref 250–450)

## 2021-08-05 ENCOUNTER — Other Ambulatory Visit: Payer: Self-pay

## 2021-08-05 ENCOUNTER — Telehealth: Payer: Self-pay

## 2021-08-05 MED ORDER — NA SULFATE-K SULFATE-MG SULF 17.5-3.13-1.6 GM/177ML PO SOLN
1.0000 | ORAL | 0 refills | Status: DC
Start: 2021-08-05 — End: 2021-09-17

## 2021-08-05 NOTE — Telephone Encounter (Signed)
Tried to call pt to schedule TCS/-/+EGD w/Propofol w/Dr. Abbey Chatters, LMOVM for return call.

## 2021-08-05 NOTE — Telephone Encounter (Signed)
Spoke to pt, TCS/-/+EGD scheduled for 09/17/21 at 8:00am. Rx for prep sent to pharmacy. Orders entered. Instructions mailed.

## 2021-09-17 ENCOUNTER — Ambulatory Visit (HOSPITAL_COMMUNITY): Payer: BC Managed Care – PPO | Admitting: Anesthesiology

## 2021-09-17 ENCOUNTER — Encounter (HOSPITAL_COMMUNITY)
Admission: RE | Disposition: A | Discharge status: Home / Self Care | Payer: Self-pay | Source: Home / Self Care | Attending: Internal Medicine

## 2021-09-17 ENCOUNTER — Encounter (HOSPITAL_COMMUNITY): Payer: Self-pay

## 2021-09-17 ENCOUNTER — Other Ambulatory Visit: Payer: Self-pay

## 2021-09-17 ENCOUNTER — Ambulatory Visit (HOSPITAL_COMMUNITY)
Admission: RE | Admit: 2021-09-17 | Discharge: 2021-09-17 | Disposition: A | Discharge status: Home / Self Care | Payer: BC Managed Care – PPO | Attending: Internal Medicine | Admitting: Internal Medicine

## 2021-09-17 DIAGNOSIS — Z87891 Personal history of nicotine dependence: Secondary | ICD-10-CM | POA: Diagnosis not present

## 2021-09-17 DIAGNOSIS — K648 Other hemorrhoids: Secondary | ICD-10-CM | POA: Diagnosis not present

## 2021-09-17 DIAGNOSIS — D509 Iron deficiency anemia, unspecified: Secondary | ICD-10-CM

## 2021-09-17 DIAGNOSIS — D120 Benign neoplasm of cecum: Secondary | ICD-10-CM | POA: Insufficient documentation

## 2021-09-17 DIAGNOSIS — R519 Headache, unspecified: Secondary | ICD-10-CM | POA: Insufficient documentation

## 2021-09-17 HISTORY — PX: COLONOSCOPY WITH PROPOFOL: SHX5780

## 2021-09-17 HISTORY — PX: BIOPSY: SHX5522

## 2021-09-17 SURGERY — COLONOSCOPY WITH PROPOFOL
Anesthesia: General

## 2021-09-17 MED ORDER — PROPOFOL 10 MG/ML IV BOLUS
INTRAVENOUS | Status: DC | PRN
  Administered 2021-09-17 (×2): 50 mg via INTRAVENOUS
  Administered 2021-09-17: 100 mg via INTRAVENOUS

## 2021-09-17 MED ORDER — LACTATED RINGERS IV SOLN
INTRAVENOUS | Status: DC | PRN
Start: 2021-09-17 — End: 2021-09-17

## 2021-09-17 MED ORDER — PROPOFOL 500 MG/50ML IV EMUL
INTRAVENOUS | Status: DC | PRN
  Administered 2021-09-17: 150 ug/kg/min via INTRAVENOUS

## 2021-09-17 MED ORDER — LACTATED RINGERS IV SOLN
INTRAVENOUS | Status: DC
  Administered 2021-09-17: 07:00:00 1000 mL via INTRAVENOUS

## 2021-09-17 NOTE — H&P (Signed)
Primary Care Physician:  Lindell Spar, MD Primary Gastroenterologist:  Dr. Abbey Chatters  Pre-Procedure History & Physical: HPI:  Sherri Salazar is a 55 y.o. female is here for a colonoscopy to be performed for iron deficiency anemia.  Patient also scheduled for EGD to be performed at the same time though she wishes to forego this.  I discussed that part of a typical anemia work-up we do perform both EGD and colonoscopy and there is a small chance that we could be missing something in her upper GI tract by not performing EGD and she understands.  We will proceed with colonoscopy only today.  Past Medical History:  Diagnosis Date   Migraines    PONV (postoperative nausea and vomiting)     Past Surgical History:  Procedure Laterality Date   ABLATION     CESAREAN SECTION  2000,2002   VENTRAL HERNIA REPAIR N/A 03/09/2020   Procedure: VENTRAL HERNIA REPAIR;  Surgeon: Virl Cagey, MD;  Location: [facility];  Service: General;  Laterality: N/A;    Prior to Admission medications   Medication Sig Start Date End Date Taking? Authorizing Provider  Aspirin-Acetaminophen-Caffeine (EXCEDRIN MIGRAINE PO) Take 2 tablets by mouth 3 (three) times daily as needed (migraines).    Yes [provider]  Multiple Vitamins-Minerals (EMERGEN-C IMMUNE PLUS/VIT D) CHEW Chew 2 tablets by mouth daily.   Yes [provider]  Multiple Vitamins-Minerals (WOMENS MULTI VITAMIN & MINERAL PO) Take 1 tablet by mouth daily.   Yes [provider]  Na Sulfate-K Sulfate-Mg Sulf (SUPREP BOWEL PREP KIT) 17.5-3.13-1.6 GM/177ML SOLN Take 1 kit by mouth as directed. 08/05/21  Yes Eloise Harman, DO    Allergies as of 08/05/2021 - Review Complete 07/31/2021  Allergen Reaction Noted   Lexapro [escitalopram] Other (See Comments) 02/19/2021   Prozac [fluoxetine] Other (See Comments) 02/19/2021   Zoloft [sertraline hcl] Other (See Comments) 02/19/2021   Wellbutrin [bupropion] Hives and Rash 02/19/2021    Vicodin [hydrocodone-acetaminophen] Other (See Comments) 11/09/2019    Family History  Problem Relation Age of Onset   Parkinson's disease Father    Colon cancer Neg Hx     Social History   Socioeconomic History   Marital status: Divorced    Spouse name: Not on file   Number of children: Not on file   Years of education: Not on file   Highest education level: Not on file  Occupational History   Not on file  Tobacco Use   Smoking status: Former    Types: Cigarettes    Quit date: 09/29/1985    Years since quitting: 35.9   Smokeless tobacco: Never   Tobacco comments:    tried but not consistent  Vaping Use   Vaping Use: Never used  Substance and Sexual Activity   Alcohol use: No   Drug use: No   Sexual activity: Not Currently    Birth control/protection: Surgical  Other Topics Concern   Not on file  Social History Narrative   Divorced since April 2022,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   Social Determinants of Health   Financial Resource Strain: Low Risk    Difficulty of Paying Living Expenses: Not very hard  Food Insecurity: Food Insecurity Present   Worried About Running Out of Food in the Last Year: Sometimes true   Ran Out of Food in the Last Year: Never true  Transportation Needs: No Transportation Needs   Lack of Transportation (Medical): No   Lack of Transportation (Non-Medical):  No  Physical Activity: Sufficiently Active   Days of Exercise per Week: 5 days   Minutes of Exercise per Session: 60 min  Stress: Stress Concern Present   Feeling of Stress : Very much  Social Connections: Moderately Isolated   Frequency of Communication with Friends and Family: More than three times a week   Frequency of Social Gatherings with Friends and Family: Three times a week   Attends Religious Services: More than 4 times per year   Active Member of Clubs or Organizations: No   Attends Archivist Meetings: Never   Marital Status:  Separated  Intimate Partner Violence: Not At Risk   Fear of Current or Ex-Partner: No   Emotionally Abused: No   Physically Abused: No   Sexually Abused: No    Review of Systems: See HPI, otherwise negative ROS  Physical Exam: Vital signs in last 24 hours: Temp:  [98 F (36.7 C)] 98 F (36.7 C) (12/20 0702) Pulse Rate:  [66] 66 (12/20 0702) Resp:  [17] 17 (12/20 0702) BP: (107)/(63) 107/63 (12/20 0702) SpO2:  [99 %] 99 % (12/20 0702) Weight:  [70.3 kg] 70.3 kg (12/20 0702)   General:   Alert,  Well-developed, well-nourished, pleasant and cooperative in NAD Head:  Normocephalic and atraumatic. Eyes:  Sclera clear, no icterus.   Conjunctiva pink. Ears:  Normal auditory acuity. Nose:  No deformity, discharge,  or lesions. Mouth:  No deformity or lesions, dentition normal. Neck:  Supple; no masses or thyromegaly. Lungs:  Clear throughout to auscultation.   No wheezes, crackles, or rhonchi. No acute distress. Heart:  Regular rate and rhythm; no murmurs, clicks, rubs,  or gallops. Abdomen:  Soft, nontender and nondistended. No masses, hepatosplenomegaly or hernias noted. Normal bowel sounds, without guarding, and without rebound.   Msk:  Symmetrical without gross deformities. Normal posture. Extremities:  Without clubbing or edema. Neurologic:  Alert and  oriented x4;  grossly normal neurologically. Skin:  Intact without significant lesions or rashes. Cervical Nodes:  No significant cervical adenopathy. Psych:  Alert and cooperative. Normal mood and affect.  Impression/Plan: Sherri Salazar is here for a colonoscopy to be performed for iron deficiency anemia.  Patient also scheduled for EGD to be performed at the same time though she wishes to forego this.  I discussed that part of a typical anemia work-up we do perform both EGD and colonoscopy and there is a small chance that we could be missing something in her upper GI tract by not performing EGD and she understands.  We will  proceed with colonoscopy only today.  The risks of the procedure including infection, bleed, or perforation as well as benefits, limitations, alternatives and imponderables have been reviewed with the patient. Questions have been answered. All parties agreeable.

## 2021-09-17 NOTE — Op Note (Addendum)
[facility] Patient Name: Sherri Salazar Procedure Date: 09/17/2021 7:50 AM MRN: [id number] Date of Birth: [birth year] Attending MD: Elon Alas. Abbey Chatters DO CSN: [id number] Age: 55 Admit Type: Outpatient Procedure:                Colonoscopy Indications:              Iron deficiency anemia Providers:                Elon Alas. Abbey Chatters, DO, Janeece Riggers, RN, Aram Candela Referring MD:              Medicines:                See the Anesthesia note for documentation of the                            administered medications Complications:            No immediate complications. Estimated Blood Loss:     Estimated blood loss was minimal. Procedure:                Pre-Anesthesia Assessment:                           - The anesthesia plan was to use monitored                            anesthesia care (MAC).                           After obtaining informed consent, the colonoscope                            was passed under direct vision. Throughout the                            procedure, the patient's blood pressure, pulse, and                            oxygen saturations were monitored continuously. The                            PCF-HQ190L ([id number]) scope was introduced through                            the anus and advanced to the the cecum, identified                            by appendiceal orifice and ileocecal valve. The                            colonoscopy was performed without difficulty. The                            patient tolerated the procedure well. The quality                            of the  bowel preparation was evaluated using the                            BBPS [facility] Bowel Preparation Scale) with scores                            of: Right Colon = 2 (minor amount of residual                            staining, small fragments of stool and/or opaque                            liquid, but mucosa seen well), Transverse Colon = 2                             (minor amount of residual staining, small fragments                            of stool and/or opaque liquid, but mucosa seen                            well) and Left Colon = 2 (minor amount of residual                            staining, small fragments of stool and/or opaque                            liquid, but mucosa seen well). The total BBPS score                            equals 6. The quality of the bowel preparation was                            fair. Scope In: 7:58:49 AM Scope Out: 8:20:22 AM Scope Withdrawal Time: 0 hours 13 minutes 13 seconds  Total Procedure Duration: 0 hours 21 minutes 33 seconds  Findings:      The perianal and digital rectal examinations were normal.      Non-bleeding internal hemorrhoids were found during endoscopy.      A 2 mm polyp was found in the cecum. The polyp was sessile. The polyp       was removed with a cold biopsy forceps. Resection and retrieval were       complete.      The exam was otherwise without abnormality. Impression:               - Preparation of the colon was fair.                           - Non-bleeding internal hemorrhoids.                           - One 2 mm polyp in the cecum, removed with a cold  biopsy forceps. Resected and retrieved.                           - The examination was otherwise normal. Moderate Sedation:      Per Anesthesia Care Recommendation:           - Patient has a contact number available for                            emergencies. The signs and symptoms of potential                            delayed complications were discussed with the                            patient. Return to normal activities tomorrow.                            Written discharge instructions were provided to the                            patient.                           - Resume previous diet.                           - Continue present medications.                           - Await  pathology results.                           - Repeat colonoscopy in 5 years for surveillance                            and borderline prep. Will need extended colon prep                           - Return to GI clinic PRN. Procedure Code(s):        --- Professional ---                           [phone], Colonoscopy, flexible; with biopsy, single                            or multiple Diagnosis Code(s):        --- Professional ---                           K64.8, Other hemorrhoids                           K63.5, Polyp of colon                           D50.9, Iron deficiency anemia, unspecified CPT copyright 2019 American Medical  Association. All rights reserved. The codes documented in this report are preliminary and upon coder review may  be revised to meet current compliance requirements. Elon Alas. Abbey Chatters, DO [place] Abbey Chatters, DO 09/17/2021 8:23:27 AM This report has been signed electronically. Number of Addenda: 0

## 2021-09-17 NOTE — Discharge Instructions (Addendum)
°  Colonoscopy Discharge Instructions  Read the instructions outlined below and refer to this sheet in the next few weeks. These discharge instructions provide you with general information on caring for yourself after you leave the hospital. Your doctor may also give you specific instructions. While your treatment has been planned according to the most current medical practices available, unavoidable complications occasionally occur.   ACTIVITY You may resume your regular activity, but move at a slower pace for the next 24 hours.  Take frequent rest periods for the next 24 hours.  Walking will help get rid of the air and reduce the bloated feeling in your belly (abdomen).  No driving for 24 hours (because of the medicine (anesthesia) used during the test).   Do not sign any important legal documents or operate any machinery for 24 hours (because of the anesthesia used during the test).  NUTRITION Drink plenty of fluids.  You may resume your normal diet as instructed by your doctor.  Begin with a light meal and progress to your normal diet. Heavy or fried foods are harder to digest and may make you feel sick to your stomach (nauseated).  Avoid alcoholic beverages for 24 hours or as instructed.  MEDICATIONS You may resume your normal medications unless your doctor tells you otherwise.  WHAT YOU CAN EXPECT TODAY Some feelings of bloating in the abdomen.  Passage of more gas than usual.  Spotting of blood in your stool or on the toilet paper.  IF YOU HAD POLYPS REMOVED DURING THE COLONOSCOPY: No aspirin products for 7 days or as instructed.  No alcohol for 7 days or as instructed.  Eat a soft diet for the next 24 hours.  FINDING OUT THE RESULTS OF YOUR TEST Not all test results are available during your visit. If your test results are not back during the visit, make an appointment with your caregiver to find out the results. Do not assume everything is normal if you have not heard from your  caregiver or the medical facility. It is important for you to follow up on all of your test results.  SEEK IMMEDIATE MEDICAL ATTENTION IF: You have more than a spotting of blood in your stool.  Your belly is swollen (abdominal distention).  You are nauseated or vomiting.  You have a temperature over 101.  You have abdominal pain or discomfort that is severe or gets worse throughout the day.   Your colonoscopy revealed 1 polyp(s) which I removed successfully. Await pathology results, my office will contact you. I recommend repeating colonoscopy in 5 years for surveillance purposes. Otherwise follow up with GI as needed.    I hope you have a great rest of your week!  Charles K. Carver, D.O. Gastroenterology and Hepatology [facility]  

## 2021-09-17 NOTE — Transfer of Care (Signed)
Immediate Anesthesia Transfer of Care Note  Patient: Sherri Salazar  Procedure(s) Performed: COLONOSCOPY WITH PROPOFOL BIOPSY  Patient Location: Endoscopy Unit  Anesthesia Type:General  Level of Consciousness: drowsy  Airway & Oxygen Therapy: Patient Spontanous Breathing  Post-op Assessment: Report given to RN and Post -op Vital signs reviewed and stable  Post vital signs: Reviewed and stable  Last Vitals:  Vitals Value Taken Time  BP    Temp    Pulse    Resp    SpO2      Last Pain:  Vitals:   09/17/21 0753  TempSrc:   PainSc: 0-No pain      Patients Stated Pain Goal: 9 (93/26/71 2458)  Complications: No notable events documented.

## 2021-09-17 NOTE — Anesthesia Postprocedure Evaluation (Signed)
Anesthesia Post Note  Patient: Cherysh Epperly  Procedure(s) Performed: COLONOSCOPY WITH PROPOFOL BIOPSY  Patient location during evaluation: Endoscopy Anesthesia Type: General Level of consciousness: awake and alert and oriented Pain management: pain level controlled Vital Signs Assessment: post-procedure vital signs reviewed and stable Respiratory status: spontaneous breathing, nonlabored ventilation and respiratory function stable Cardiovascular status: blood pressure returned to baseline and stable Postop Assessment: no apparent nausea or vomiting Anesthetic complications: no   No notable events documented.   Last Vitals:  Vitals:   09/17/21 0702 09/17/21 0823  BP: 107/63 (!) 92/46  Pulse: 66   Resp: 17 16  Temp: 36.7 C 36.8 C  SpO2: 99% 96%    Last Pain:  Vitals:   09/17/21 0823  TempSrc: Oral  PainSc: 0-No pain                 Simrin Vegh C Ericka Marcellus

## 2021-09-17 NOTE — Anesthesia Preprocedure Evaluation (Signed)
Anesthesia Evaluation  Patient identified by MRN, date of birth, ID band Patient awake    Reviewed: Allergy & Precautions, NPO status , Patient's Chart, lab work & pertinent test results  History of Anesthesia Complications (+) PONV and history of anesthetic complications  Airway Mallampati: II  TM Distance: >3 FB Neck ROM: Full    Dental  (+) Dental Advisory Given, Teeth Intact   Pulmonary neg pulmonary ROS, former smoker,    Pulmonary exam normal breath sounds clear to auscultation       Cardiovascular negative cardio ROS Normal cardiovascular exam Rhythm:Regular Rate:Normal     Neuro/Psych  Headaches, PSYCHIATRIC DISORDERS Depression    GI/Hepatic negative GI ROS, Neg liver ROS,   Endo/Other  negative endocrine ROS  Renal/GU negative Renal ROS  negative genitourinary   Musculoskeletal negative musculoskeletal ROS (+)   Abdominal   Peds negative pediatric ROS (+)  Hematology negative hematology ROS (+)   Anesthesia Other Findings   Reproductive/Obstetrics negative OB ROS                             Anesthesia Physical Anesthesia Plan  ASA: 2  Anesthesia Plan: General   Post-op Pain Management: Minimal or no pain anticipated   Induction:   PONV Risk Score and Plan: TIVA  Airway Management Planned: Nasal Cannula and Natural Airway  Additional Equipment:   Intra-op Plan:   Post-operative Plan:   Informed Consent: I have reviewed the patients History and Physical, chart, labs and discussed the procedure including the risks, benefits and alternatives for the proposed anesthesia with the patient or authorized representative who has indicated his/her understanding and acceptance.     Dental advisory given  Plan Discussed with: CRNA and Surgeon  Anesthesia Plan Comments:         Anesthesia Quick Evaluation

## 2021-09-19 LAB — SURGICAL PATHOLOGY

## 2021-09-20 ENCOUNTER — Encounter (HOSPITAL_COMMUNITY): Payer: Self-pay | Admitting: Internal Medicine

## 2021-10-09 ENCOUNTER — Telehealth: Payer: BC Managed Care – PPO | Admitting: Nurse Practitioner

## 2021-10-09 DIAGNOSIS — J208 Acute bronchitis due to other specified organisms: Secondary | ICD-10-CM

## 2021-10-09 MED ORDER — PREDNISONE 10 MG (21) PO TBPK: ORAL_TABLET | ORAL | 0 refills | Status: DC

## 2021-10-09 MED ORDER — BENZONATATE 100 MG PO CAPS: 100.0000 mg | ORAL_CAPSULE | Freq: Three times a day (TID) | ORAL | 0 refills | Status: DC | PRN

## 2021-10-09 NOTE — Progress Notes (Signed)
We are sorry that you are not feeling well.  Here is how we plan to help!  Based on your presentation I believe you most likely have A cough due to a virus.  This is called viral bronchitis and is best treated by rest, plenty of fluids and control of the cough.  You may use Ibuprofen or Tylenol as directed to help your symptoms.  This cough can last several weeks. Another antibiotic will not help.   In addition you may use A prescription cough medication called Tessalon Perles 100mg . You may take 1-2 capsules every 8 hours as needed for your cough.  Prednisone 10 mg daily for 6 days (see taper instructions below)  Directions for 6 day taper: Day 1: 2 tablets before breakfast, 1 after both lunch & dinner and 2 at bedtime Day 2: 1 tab before breakfast, 1 after both lunch & dinner and 2 at bedtime Day 3: 1 tab at each meal & 1 at bedtime Day 4: 1 tab at breakfast, 1 at lunch, 1 at bedtime Day 5: 1 tab at breakfast & 1 tab at bedtime Day 6: 1 tab at breakfast  From your responses in the eVisit questionnaire you describe inflammation in the upper respiratory tract which is causing a significant cough.  This is commonly called Bronchitis and has four common causes:   Allergies Viral Infections Acid Reflux Bacterial Infection Allergies, viruses and acid reflux are treated by controlling symptoms or eliminating the cause. An example might be a cough caused by taking certain blood pressure medications. You stop the cough by changing the medication. Another example might be a cough caused by acid reflux. Controlling the reflux helps control the cough.  USE OF BRONCHODILATOR ("RESCUE") INHALERS: There is a risk from using your bronchodilator too frequently.  The risk is that over-reliance on a medication which only relaxes the muscles surrounding the breathing tubes can reduce the effectiveness of medications prescribed to reduce swelling and congestion of the tubes themselves.  Although you feel brief  relief from the bronchodilator inhaler, your asthma may actually be worsening with the tubes becoming more swollen and filled with mucus.  This can delay other crucial treatments, such as oral steroid medications. If you need to use a bronchodilator inhaler daily, several times per day, you should discuss this with your provider.  There are probably better treatments that could be used to keep your asthma under control.     HOME CARE Only take medications as instructed by your medical team. Complete the entire course of an antibiotic. Drink plenty of fluids and get plenty of rest. Avoid close contacts especially the very young and the elderly Cover your mouth if you cough or cough into your sleeve. Always remember to wash your hands A steam or ultrasonic humidifier can help congestion.   GET HELP RIGHT AWAY IF: You develop worsening fever. You become short of breath You cough up blood. Your symptoms persist after you have completed your treatment plan MAKE SURE YOU  Understand these instructions. Will watch your condition. Will get help right away if you are not doing well or get worse.    Thank you for choosing an e-visit.  Your e-visit answers were reviewed by a board certified advanced clinical practitioner to complete your personal care plan. Depending upon the condition, your plan could have included both over the counter or prescription medications.  Please review your pharmacy choice. Make sure the pharmacy is open so you can pick up prescription now.  If there is a problem, you may contact your provider through [employer] and have the prescription routed to another pharmacy.  Your safety is important to [place]. If you have drug allergies check your prescription carefully.   For the next 24 hours you can use MyChart to ask questions about today's visit, request a non-urgent call back, or ask for a work or school excuse. You will get an email in the next two days asking about  your experience. I hope that your e-visit has been valuable and will speed your recovery.   5-10 minutes spent reviewing and documenting in chart.

## 2021-10-22 ENCOUNTER — Telehealth: Payer: Self-pay | Admitting: Internal Medicine

## 2021-10-22 NOTE — Telephone Encounter (Signed)
We don't handle anything with the billing/coding. We just schedule.

## 2021-10-22 NOTE — Telephone Encounter (Signed)
Pt had colonoscopy last month and said that she called her insurance company and was told to call us about adding an addendum because it was coded wrong. She has multiple questions about cologuard and her PCP that is now deceased. I was going to give her the billing office number but she was doing her 4 mile walk and didn't have a pen. Please call [phone]

## 2021-10-23 NOTE — Telephone Encounter (Signed)
I reviewed the op report and the indication for the procedure was anemia which makes the procedure diagnostic and not a screening.  Also, you are correct about the [employer] screenings.  Those benefits didn't become effective until 2023.

## 2021-10-24 ENCOUNTER — Encounter (INDEPENDENT_AMBULATORY_CARE_PROVIDER_SITE_OTHER): Payer: Self-pay

## 2021-10-28 NOTE — Telephone Encounter (Signed)
Phoned and advised the pt of the above regarding diagnostic verses screening. The pt advised me that where she works at has downsized and that affects her income a lot. I advised her that just try and pay as she pay something monthly but she really needs to call the billing dept. Pt expressed understanding.

## 2021-11-07 ENCOUNTER — Other Ambulatory Visit: Payer: Self-pay

## 2021-11-07 ENCOUNTER — Ambulatory Visit: Payer: BC Managed Care – PPO | Admitting: Internal Medicine

## 2021-11-07 ENCOUNTER — Encounter: Payer: Self-pay | Admitting: Internal Medicine

## 2021-11-07 VITALS — BP 102/72 | HR 75 | Resp 18 | Ht 63.0 in | Wt 161.1 lb

## 2021-11-07 DIAGNOSIS — E559 Vitamin D deficiency, unspecified: Secondary | ICD-10-CM

## 2021-11-07 DIAGNOSIS — Z23 Encounter for immunization: Secondary | ICD-10-CM

## 2021-11-07 DIAGNOSIS — F331 Major depressive disorder, recurrent, moderate: Secondary | ICD-10-CM | POA: Diagnosis not present

## 2021-11-07 DIAGNOSIS — Z0001 Encounter for general adult medical examination with abnormal findings: Secondary | ICD-10-CM | POA: Diagnosis not present

## 2021-11-07 DIAGNOSIS — G43709 Chronic migraine without aura, not intractable, without status migrainosus: Secondary | ICD-10-CM | POA: Diagnosis not present

## 2021-11-07 DIAGNOSIS — D649 Anemia, unspecified: Secondary | ICD-10-CM

## 2021-11-07 DIAGNOSIS — Z114 Encounter for screening for human immunodeficiency virus [HIV]: Secondary | ICD-10-CM

## 2021-11-07 DIAGNOSIS — Z1159 Encounter for screening for other viral diseases: Secondary | ICD-10-CM

## 2021-11-07 DIAGNOSIS — Z0000 Encounter for general adult medical examination without abnormal findings: Secondary | ICD-10-CM

## 2021-11-07 MED ORDER — CITALOPRAM HYDROBROMIDE 10 MG PO TABS: 10.0000 mg | ORAL_TABLET | Freq: Every day | ORAL | 3 refills | Status: DC

## 2021-11-07 NOTE — Assessment & Plan Note (Signed)
Unclear etiology Will check CBC today

## 2021-11-07 NOTE — Progress Notes (Signed)
shingri

## 2021-11-07 NOTE — Progress Notes (Signed)
New Patient Office Visit  Subjective:  Patient ID: Sherri Salazar, female    DOB: Mar 04, 1966  Age: 56 y.o. MRN: [id number]  CC:  Chief Complaint  Patient presents with   New Patient (Initial Visit)    New patient was seeing Anastasio Champion feels fatigued all the time also feels like weight is an issue    HPI Sherri Salazar is a 56 y.o. female with past medical history of migraine, MDD and anemia who presents for establishing care.  Migraine: She has tried Topamax in the past for migraine, but did not help her.  She currently takes Excedrin migraine as needed for migraine, and requires it only once in a month. She has not used it for the last 6 months.  MDD: She reports anhedonia, difficulty concentrating, lack of interest in routine activities and stress at work.  She has to work to 1 in the morning at times and has to wake up at 5:30 to get to work.  She works at school currently.  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  She denies any SI or HI currently.  She has tried multiple SSRIs in the past.  Her last CBC showed mild anemia.  She had colonoscopy 2 rule out any occult GI bleeding, but it showed only one benign polyp and nonbleeding internal hemorrhoids.  She denies any melena or hematochezia currently.  She takes multivitamin supplement currently.  Denies any exertional dyspnea.  She walks about 4 miles everyday.   Past Medical History:  Diagnosis Date   Migraines    PONV (postoperative nausea and vomiting)     Past Surgical History:  Procedure Laterality Date   ABLATION     BIOPSY  09/17/2021   Procedure: BIOPSY;  Surgeon: Eloise Harman, DO;  Location: [facility];  Service: Endoscopy;;   CESAREAN SECTION  2000,2002   COLONOSCOPY WITH PROPOFOL N/A 09/17/2021   Procedure: COLONOSCOPY WITH PROPOFOL;  Surgeon: Eloise Harman, DO;  Location: [facility];  Service: Endoscopy;  Laterality: N/A;  8:00am    VENTRAL HERNIA REPAIR N/A 03/09/2020   Procedure: VENTRAL HERNIA REPAIR;  Surgeon: Virl Cagey, MD;  Location: [facility];  Service: General;  Laterality: N/A;    Family History  Problem Relation Age of Onset   Parkinson's disease Father    Colon cancer Neg Hx     Social History   Socioeconomic History   Marital status: Divorced    Spouse name: Not on file   Number of children: Not on file   Years of education: Not on file   Highest education level: Not on file  Occupational History   Not on file  Tobacco Use   Smoking status: Former    Types: Cigarettes    Quit date: 09/29/1985    Years since quitting: 36.1   Smokeless tobacco: Never   Tobacco comments:    tried but not consistent  Vaping Use   Vaping Use: Never used  Substance and Sexual Activity   Alcohol use: No   Drug use: No   Sexual activity: Not Currently    Birth control/protection: Surgical  Other Topics Concern   Not on file  Social History Narrative   Divorced since April 2022,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   Social Determinants of Health   Financial Resource Strain: Low Risk    Difficulty of Paying Living Expenses: Not very  hard  Food Insecurity: Food Insecurity Present   Worried About Charity fundraiser in the Last Year: Sometimes true   Arboriculturist in the Last Year: Never true  Transportation Needs: No Transportation Needs   Lack of Transportation (Medical): No   Lack of Transportation (Non-Medical): No  Physical Activity: Sufficiently Active   Days of Exercise per Week: 5 days   Minutes of Exercise per Session: 60 min  Stress: Stress Concern Present   Feeling of Stress : Very much  Social Connections: Moderately Isolated   Frequency of Communication with Friends and Family: More than three times a week   Frequency of Social Gatherings with Friends and Family: Three times a week   Attends Religious Services: More than 4 times per year   Active Member  of Clubs or Organizations: No   Attends Archivist Meetings: Never   Marital Status: Separated  Intimate Partner Violence: Not At Risk   Fear of Current or Ex-Partner: No   Emotionally Abused: No   Physically Abused: No   Sexually Abused: No    ROS Review of Systems  Constitutional:  Positive for fatigue. Negative for chills and fever.  HENT:  Negative for congestion, sinus pressure, sinus pain and sore throat.   Eyes:  Negative for pain and discharge.  Respiratory:  Negative for cough and shortness of breath.   Cardiovascular:  Negative for chest pain and palpitations.  Gastrointestinal:  Negative for abdominal pain, diarrhea, nausea and vomiting.  Endocrine: Negative for polydipsia and polyuria.  Genitourinary:  Negative for dysuria and hematuria.  Musculoskeletal:  Negative for neck pain and neck stiffness.  Skin:  Negative for rash.  Neurological:  Negative for dizziness and weakness.  Psychiatric/Behavioral:  Positive for decreased concentration, dysphoric mood and sleep disturbance. Negative for agitation and behavioral problems. The patient is nervous/anxious.    Objective:   Today's Vitals: BP 102/72 (BP Location: Left Arm, Patient Position: Sitting, Cuff Size: Normal)    Pulse 75    Resp 18    Ht _0  (1.6 m)    Wt 161 lb 1.3 oz (73.1 kg)    SpO2 98%    BMI 28.53 kg/m   Physical Exam Vitals reviewed.  Constitutional:      General: She is not in acute distress.    Appearance: She is not diaphoretic.  HENT:     Head: Normocephalic and atraumatic.     Nose: Nose normal.     Mouth/Throat:     Mouth: Mucous membranes are moist.  Eyes:     General: No scleral icterus.    Extraocular Movements: Extraocular movements intact.  Cardiovascular:     Rate and Rhythm: Normal rate and regular rhythm.     Pulses: Normal pulses.     Heart sounds: Normal heart sounds. No murmur heard. Pulmonary:     Breath sounds: Normal breath sounds. No wheezing or rales.   Abdominal:     Palpations: Abdomen is soft.     Tenderness: There is no abdominal tenderness.  Musculoskeletal:     Cervical back: Neck supple. No tenderness.     Right lower leg: No edema.     Left lower leg: No edema.  Skin:    General: Skin is warm.     Findings: No rash.  Neurological:     General: No focal deficit present.     Mental Status: She is alert and oriented to person, place, and time.  Sensory: No sensory deficit.     Motor: No weakness.  Psychiatric:        Mood and Affect: Mood is depressed. Affect is tearful.        Behavior: Behavior is slowed.    Assessment & Plan:   Problem List Items Addressed This Visit       Cardiovascular and Mediastinum   Migraines - Primary    Well-controlled with Excedrin migraine as needed      Relevant Medications   citalopram (CELEXA) 10 MG tablet     Other   Depression    Flowsheet Row Office Visit from 11/07/2021 in [facility]  PHQ-9 Total Score 17  Has tried multiple SSRIs and Wellbutrin in the past We will try Celexa Advised to contact if she has any adverse reaction Advised to stop Celexa if she has suicidal thoughts and get medical attention immediately Referred to [facility] therapy      Relevant Medications   citalopram (CELEXA) 10 MG tablet   Other Relevant Orders   AMB Referral to Community Care Coordinaton   Anemia    Unclear etiology Will check CBC today      Other Visit Diagnoses     Annual physical exam       Relevant Orders   VITAMIN D 25 Hydroxy (Vit-D Deficiency, Fractures)   TSH   Lipid panel   Hemoglobin A1c   CMP14+EGFR   CBC with Differential/Platelet   Vitamin D deficiency       Relevant Orders   VITAMIN D 25 Hydroxy (Vit-D Deficiency, Fractures)   Encounter for screening for HIV       Relevant Orders   HIV antibody (with reflex)   Need for hepatitis C screening test       Relevant Orders   Hepatitis C Antibody   Need for varicella vaccine       Relevant Orders    Varicella-zoster vaccine IM (Shingrix) (Completed)       Outpatient Encounter Medications as of 11/07/2021  Medication Sig   Aspirin-Acetaminophen-Caffeine (EXCEDRIN MIGRAINE PO) Take 2 tablets by mouth 3 (three) times daily as needed (migraines).    citalopram (CELEXA) 10 MG tablet Take 1 tablet (10 mg total) by mouth daily.   Multiple Vitamins-Minerals (EMERGEN-C IMMUNE PLUS/VIT D) CHEW Chew 2 tablets by mouth daily.   Multiple Vitamins-Minerals (WOMENS MULTI VITAMIN & MINERAL PO) Take 1 tablet by mouth daily.   [DISCONTINUED] benzonatate (TESSALON PERLES) 100 MG capsule Take 1 capsule (100 mg total) by mouth 3 (three) times daily as needed. (Patient not taking: Reported on 11/07/2021)   [DISCONTINUED] predniSONE (STERAPRED UNI-PAK 21 TAB) 10 MG (21) TBPK tablet As directed x 6 days (Patient not taking: Reported on 11/07/2021)   No facility-administered encounter medications on file as of 11/07/2021.    Follow-up: Return in about 2 months (around 01/05/2022) for MDD.   Lindell Spar, MD

## 2021-11-07 NOTE — Patient Instructions (Signed)
Please start taking Celexa as prescribed.  Please continue to take supplements.  Please continue to perform moderate exercise as tolerated.

## 2021-11-07 NOTE — Assessment & Plan Note (Signed)
[place] Office Visit from 11/07/2021 in [facility]  PHQ-9 Total Score 17     Has tried multiple SSRIs and Wellbutrin in the past We will try Celexa Advised to contact if she has any adverse reaction Advised to stop Celexa if she has suicidal thoughts and get medical attention immediately Referred to [facility] therapy

## 2021-11-07 NOTE — Assessment & Plan Note (Signed)
Well-controlled with Excedrin migraine as needed 

## 2021-11-08 ENCOUNTER — Telehealth: Payer: Self-pay | Admitting: *Deleted

## 2021-11-08 NOTE — Chronic Care Management (AMB) (Signed)
°  Care Management   Outreach Note  11/08/2021 Name: Sherri Salazar MRN: [id number] DOB: 1965-11-17  Referred by: Lindell Spar, MD Reason for referral : Care Coordination (Initial outreach to schedule referral with SW)   An unsuccessful telephone outreach was attempted today. The patient was referred to the case management team for assistance with care management and care coordination.   Follow Up Plan:  A HIPAA compliant phone message was left for the patient providing contact information and requesting a return call.  The care management team will reach out to the patient again over the next 7 days.  If patient returns call to provider office, please advise to call [place] * at [phone].*  [facility]  Direct Dial: [phone]

## 2021-11-11 NOTE — Chronic Care Management (AMB) (Signed)
°  Care Management   Note  11/11/2021 Name: Sherri Salazar MRN: [id number] DOB: [birth year]  Sherri Salazar is a 56 y.o. year old female who is a primary care patient of Lindell Spar, MD. I reached out to Conley Canal by phone today in response to a referral sent by [name]'s primary care provider.   [name] was given information about care management services today including:  Care management services include personalized support from designated clinical staff supervised by her physician, including individualized plan of care and coordination with other care providers 24/7 contact phone numbers for assistance for urgent and routine care needs. The patient may stop care management services at any time by phone call to the office staff.  Patient agreed to services and verbal consent obtained.   Follow up plan: Face to Face appointment with care management team member scheduled for: 01/03/22  [facility]  Direct Dial: [phone]

## 2021-11-29 ENCOUNTER — Other Ambulatory Visit: Payer: Self-pay | Admitting: Internal Medicine

## 2021-11-29 DIAGNOSIS — F331 Major depressive disorder, recurrent, moderate: Secondary | ICD-10-CM

## 2021-12-21 LAB — CBC WITH DIFFERENTIAL/PLATELET
Basophils Absolute: 0.1 10*3/uL (ref 0.0–0.2)
Basos: 1 %
EOS (ABSOLUTE): 0.1 10*3/uL (ref 0.0–0.4)
Eos: 2 %
Hematocrit: 37.4 % (ref 34.0–46.6)
Hemoglobin: 12.4 g/dL (ref 11.1–15.9)
Immature Grans (Abs): 0 10*3/uL (ref 0.0–0.1)
Immature Granulocytes: 0 %
Lymphocytes Absolute: 1.6 10*3/uL (ref 0.7–3.1)
Lymphs: 31 %
MCH: 28.6 pg (ref 26.6–33.0)
MCHC: 33.2 g/dL (ref 31.5–35.7)
MCV: 86 fL (ref 79–97)
Monocytes Absolute: 0.4 10*3/uL (ref 0.1–0.9)
Monocytes: 8 %
Neutrophils Absolute: 3 10*3/uL (ref 1.4–7.0)
Neutrophils: 58 %
Platelets: 289 10*3/uL (ref 150–450)
RBC: 4.34 x10E6/uL (ref 3.77–5.28)
RDW: 12.6 % (ref 11.7–15.4)
WBC: 5.2 10*3/uL (ref 3.4–10.8)

## 2021-12-21 LAB — CMP14+EGFR
ALT: 15 IU/L (ref 0–32)
AST: 26 IU/L (ref 0–40)
Albumin/Globulin Ratio: 1.6 (ref 1.2–2.2)
Albumin: 4.5 g/dL (ref 3.8–4.9)
Alkaline Phosphatase: 71 IU/L (ref 44–121)
BUN/Creatinine Ratio: 19 (ref 9–23)
BUN: 18 mg/dL (ref 6–24)
Bilirubin Total: <0.2 mg/dL (ref 0.0–1.2)
CO2: 26 mmol/L (ref 20–29)
Calcium: 9.8 mg/dL (ref 8.7–10.2)
Chloride: 104 mmol/L (ref 96–106)
Creatinine, Ser: 0.95 mg/dL (ref 0.57–1.00)
Globulin, Total: 2.8 g/dL (ref 1.5–4.5)
Glucose: 94 mg/dL (ref 70–99)
Potassium: 4.9 mmol/L (ref 3.5–5.2)
Sodium: 142 mmol/L (ref 134–144)
Total Protein: 7.3 g/dL (ref 6.0–8.5)
eGFR: 72 mL/min/{1.73_m2} (ref >59)

## 2021-12-21 LAB — LIPID PANEL
Chol/HDL Ratio: 1.9 ratio (ref 0.0–4.4)
Cholesterol, Total: 163 mg/dL (ref 100–199)
HDL: 86 mg/dL (ref >39)
LDL Chol Calc (NIH): 68 mg/dL (ref 0–99)
Triglycerides: 42 mg/dL (ref 0–149)
VLDL Cholesterol Cal: 9 mg/dL (ref 5–40)

## 2021-12-21 LAB — HEPATITIS C ANTIBODY: Hep C Virus Ab: NONREACTIVE

## 2021-12-21 LAB — HIV ANTIBODY (ROUTINE TESTING W REFLEX): HIV Screen 4th Generation wRfx: NONREACTIVE

## 2021-12-21 LAB — HEMOGLOBIN A1C
Est. average glucose Bld gHb Est-mCnc: 111 mg/dL
Hgb A1c MFr Bld: 5.5 % (ref 4.8–5.6)

## 2021-12-21 LAB — VITAMIN D 25 HYDROXY (VIT D DEFICIENCY, FRACTURES): Vit D, 25-Hydroxy: 39.9 ng/mL (ref 30.0–100.0)

## 2021-12-21 LAB — TSH: TSH: 3.19 u[IU]/mL (ref 0.450–4.500)

## 2022-01-03 ENCOUNTER — Ambulatory Visit: Payer: BC Managed Care – PPO

## 2022-01-03 ENCOUNTER — Ambulatory Visit: Payer: BC Managed Care – PPO | Admitting: Internal Medicine

## 2022-01-06 ENCOUNTER — Ambulatory Visit: Payer: BC Managed Care – PPO | Admitting: Licensed Clinical Social Worker

## 2022-01-06 DIAGNOSIS — G43709 Chronic migraine without aura, not intractable, without status migrainosus: Secondary | ICD-10-CM

## 2022-01-06 DIAGNOSIS — D649 Anemia, unspecified: Secondary | ICD-10-CM

## 2022-01-06 DIAGNOSIS — E559 Vitamin D deficiency, unspecified: Secondary | ICD-10-CM

## 2022-01-06 DIAGNOSIS — J208 Acute bronchitis due to other specified organisms: Secondary | ICD-10-CM

## 2022-01-06 DIAGNOSIS — F331 Major depressive disorder, recurrent, moderate: Secondary | ICD-10-CM

## 2022-01-06 NOTE — Patient Instructions (Signed)
Visit Information ? ?Thank you for taking time to visit with me today. Please don't hesitate to contact me if I can be of assistance to you before our next scheduled telephone appointment. ? ?Following are the goals we discussed today:  ? ?Our next appointment is by telephone on 02/06/22 at 1:00 PM ? ?Please call the care guide team at [phone] if you need to cancel or reschedule your appointment.  ? ?If you are experiencing a Mental Health or [place] or need someone to talk to, please call the [facility]: [phone]  ? ?Following is a copy of your full plan of care:  ?Care Plan : [place]  ?Updates made by Katha Cabal, LCSW since 01/06/2022 12:00 AM  ?  ? ?Problem: Emotional Distress   ?  ? ?Goal: Emotional Health Supported. Manage depression. Manage anxiety   ?Start Date: 01/06/2022  ?Expected End Date: 04/04/2022  ?This Visit's Progress: Not on track  ?Priority: High  ?Note:   ?Current Barriers:  ?Depression issues ?Financial issues ?Suicidal Ideation/Homicidal Ideation: No ? ?Clinical Social Work Goal(s):  ?patient will work with SW monthly by telephone or in person to reduce or manage symptoms related to depression and anxiety ?Patient will attend scheduled medical appointments in next 30 days ?Patient will call RNCM as needed for nursing support ?Patient will call LCSW as needed in next 30 days for discussion of financial challenges of client ? ?Interventions: ?Patient interviewed and appropriate assessments performed: PHQ: 2/9.  GAD-7 ?1:1 collaboration with Lindell Spar, MD regarding development ; update of comprehensive plan of care as evidenced by provider attestation and co-signature ?Discussed client needs with Conley Canal ?Reviewed medication procurement. ?Discussed financial challenges with client. She is concerned about job situation and is looking for part time work.  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  She is  applying for other positions. She has been a Pharmacist, hospital for 23 years. ?Discussed family support. Her two daughters are in college. She has support from her parents who live in the area ?Discussed sleeping issues. It is challenging for her to sleep sometimes ?Reviewed mood of client. She said she is taking Celexa as prescribed.  She feels that Celexa is helping her mood. She enjoys walking as a form of exercise. ?Reviewed appetite of client . She eats small meals daily.  ?Provided counseling support for client ?Discussed relaxation techniques of client (she likes to read, she likes to walk as a form of exercise, she likes listening to music) ?Encouraged Shernell to call RNCM as needed for nursing support ?Reviewed energy level. She said sometimes she feels fatigued or has low energy.  She does have diagnosis of anemia. She takes a supplement to help with anemia issues ?Discussed stress management of client. She said she feels that she becomes stressed in worrying about her finances ?Encouraged client to call LCSW in next 30 days as needed for SW support ? ?Patient Self Care Activities:  ?Walks as form of exercise ?Talks with parents regularly ?Does ADLs on her own ?Has no transport issues ? ?Patient Coping Strengths:  ? ?Family support ?Worked as Pharmacist, hospital for 23 years. Familiar with job responsibilities ? ?Patient Self Care Deficits:  ?Depression ?Anxiety ?Financial challenges ? ?Patient Goals:  ?- spend time or talk with others at least 2 to 3 times per week ?- practice relaxation or meditation daily ?- keep a calendar with appointment dates ? ?Follow Up Plan:  LCSW to call client on May 11,2023 at 1:00 PM  ?  ? ?Sherri Salazar was given information about Care Management services by the embedded care coordination team including:  ?Care Management services include personalized support from designated clinical staff supervised by her physician, including individualized plan of care and coordination with other care  providers ?24/7 contact phone numbers for assistance for urgent and routine care needs. ?The patient may stop CCM services at any time (effective at the end of the month) by phone call to the office staff. ? ?Patient agreed to services and verbal consent obtained.  ? ?Norva Riffle.Mitsue Peery MSW, LCSW ?Licensed Clinical Social Worker ?[facility] ?[phone] ?

## 2022-01-06 NOTE — Chronic Care Management (AMB) (Signed)
Care Management ?Clinical Social Work Note ? ?01/06/2022 ?Name: Sherri Salazar MRN: [id number] DOB: 09/22/1966 ? ?Sherri Salazar is a 56 y.o. year old female who is a primary care patient of Sherri Spar, MD.  The Care Management team was consulted for assistance with chronic disease management and coordination needs. ? ?Engaged with patient by telephone for initial visit in response to provider referral for social work chronic care management and care coordination services ? ?Consent to Services:  ?Ms. Saad was given information about Care Management services today including:  ?Care Management services includes personalized support from designated clinical staff supervised by her physician, including individualized plan of care and coordination with other care providers ?24/7 contact phone numbers for assistance for urgent and routine care needs. ?The patient may stop case management services at any time by phone call to the office staff. ? ?Patient agreed to services and consent obtained.  ? ?Assessment: Review of patient past medical history, allergies, medications, and health status, including review of relevant consultants reports was performed today as part of a comprehensive evaluation and provision of chronic care management and care coordination services. ? ?SDOH (Social Determinants of Health) assessments and interventions performed:  ?SDOH Interventions   ? ?Flowsheet Row Most Recent Value  ?SDOH Interventions   ?Stress Interventions Provide Counseling  [client has stress related to finances and managing finances]  ?Depression Interventions/Treatment  Counseling, Medication  ? ?  ?  ? ?Advanced Directives Status: See [employer] application for related entries. ? ?Care Plan ? ?Allergies  ?Allergen Reactions  ? Lexapro [Escitalopram] Other (See Comments)  ?  6 mths Cried all  the time.   ? Prozac [Fluoxetine] Other (See Comments)  ?  Suicidal thoughts  ? Zoloft [Sertraline Hcl] Other (See Comments)  ?   Suicidal thoughts  ? Wellbutrin [Bupropion] Hives and Rash  ? Vicodin [Hydrocodone-Acetaminophen] Other (See Comments)  ?  hallucinate  ? ? ?Outpatient Encounter Medications as of 01/06/2022  ?Medication Sig  ? Aspirin-Acetaminophen-Caffeine (EXCEDRIN MIGRAINE PO) Take 2 tablets by mouth 3 (three) times daily as needed (migraines).   ? citalopram (CELEXA) 10 MG tablet TAKE 1 TABLET BY MOUTH EVERY DAY  ? Multiple Vitamins-Minerals (EMERGEN-C IMMUNE PLUS/VIT D) CHEW Chew 2 tablets by mouth daily.  ? Multiple Vitamins-Minerals (WOMENS MULTI VITAMIN & MINERAL PO) Take 1 tablet by mouth daily.  ? ?No facility-administered encounter medications on file as of 01/06/2022.  ? ? ?Patient Active Problem List  ? Diagnosis Date Noted  ? Anemia 11/07/2021  ? Incarcerated epigastric hernia   ? Depression 11/09/2019  ? Ventral hernia without obstruction or gangrene 11/09/2019  ? Varicose veins of right lower extremity with complications 77/41/2878  ? Migraines 12/15/2013  ? ? ?Conditions to be addressed/monitored: monitor client management of anxiety and stress issues ? ?Care Plan : LCSW Care Plan  ?Updates made by Sherri Cabal, LCSW since 01/06/2022 12:00 AM  ?  ? ?Problem: Emotional Distress   ?  ? ?Goal: Emotional Health Supported. Manage depression. Manage anxiety   ?Start Date: 01/06/2022  ?Expected End Date: 04/04/2022  ?This Visit's Progress: Not on track  ?Priority: High  ?Note:   ?Current Barriers:  ?Depression issues ?Financial issues ?Suicidal Ideation/Homicidal Ideation: No ? ?Clinical Social Work Goal(s):  ?patient will work with SW monthly by telephone or in person to reduce or manage symptoms related to depression and anxiety ?Patient will attend scheduled medical appointments in next 30 days ?Patient will call RNCM as needed for nursing support ?Patient will  call LCSW as needed in next 30 days for discussion of financial challenges of client ? ?Interventions: ?Patient interviewed and appropriate assessments  performed: PHQ: 2/9.  GAD-7 ?1:1 collaboration with Sherri Spar, MD regarding development ; update of comprehensive plan of care as evidenced by provider attestation and co-signature ?Discussed client needs with Sherri Salazar ?Reviewed medication procurement. ?Discussed financial challenges with client. She is concerned about job situation and is looking for part time work.  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  She is applying for other positions. She has been a Pharmacist, hospital for 23 years. ?Discussed family support. Her two daughters are in college. She has support from her parents who live in the area ?Discussed sleeping issues. It is challenging for her to sleep sometimes ?Reviewed mood of client. She said she is taking Celexa as prescribed.  She feels that Celexa is helping her mood. She enjoys walking as a form of exercise. ?Reviewed appetite of client . She eats small meals daily.  ?Provided counseling support for client ?Discussed relaxation techniques of client (she likes to read, she likes to walk as a form of exercise, she likes listening to music) ?Encouraged Sherri Salazar to call RNCM as needed for nursing support ?Reviewed energy level. She said sometimes she feels fatigued or has low energy.  She does have diagnosis of anemia. She takes a supplement to help with anemia issues ?Discussed stress management of client. She said she feels that she becomes stressed in worrying about her finances ?Encouraged client to call LCSW in next 30 days as needed for SW support ? ?Patient Self Care Activities:  ?Walks as form of exercise ?Talks with parents regularly ?Does ADLs on her own ?Has no transport issues ? ?Patient Coping Strengths:  ? ?Family support ?Worked as Pharmacist, hospital for 23 years. Familiar with job responsibilities ? ?Patient Self Care Deficits:  ?Depression ?Anxiety ?Financial challenges ? ?Patient Goals:  ?- spend time or talk with others at least 2 to 3 times per week ?- practice  relaxation or meditation daily ?- keep a calendar with appointment dates ? ?Follow Up Plan:  LCSW to call client on May 11,2023 at 1:00 PM  ?  ? ?Norva Riffle.Rotunda Worden MSW, LCSW ?Licensed Clinical Social Worker ?[facility] ?[phone] ?

## 2022-01-14 ENCOUNTER — Ambulatory Visit: Payer: BC Managed Care – PPO | Admitting: Internal Medicine

## 2022-01-15 ENCOUNTER — Encounter: Payer: Self-pay | Admitting: Internal Medicine

## 2022-01-15 ENCOUNTER — Ambulatory Visit: Payer: BC Managed Care – PPO | Admitting: Internal Medicine

## 2022-01-15 VITALS — BP 122/84 | HR 67 | Resp 18 | Ht 62.0 in | Wt 163.4 lb

## 2022-01-15 DIAGNOSIS — E669 Obesity, unspecified: Secondary | ICD-10-CM | POA: Insufficient documentation

## 2022-01-15 DIAGNOSIS — F331 Major depressive disorder, recurrent, moderate: Secondary | ICD-10-CM | POA: Diagnosis not present

## 2022-01-15 DIAGNOSIS — Z23 Encounter for immunization: Secondary | ICD-10-CM

## 2022-01-15 DIAGNOSIS — E663 Overweight: Secondary | ICD-10-CM | POA: Diagnosis not present

## 2022-01-15 NOTE — Patient Instructions (Signed)
Please continue to take medications as prescribed. ? ?Please continue to follow low carb diet and perform moderate exercise/walking at least 150 mins/week. ?

## 2022-01-15 NOTE — Progress Notes (Signed)
? ?Established Patient Office Visit ? ?Subjective:  ?Patient ID: Sherri Salazar, female    DOB: 02/03/1966  Age: 56 y.o. MRN: [id number] ? ?CC:  ?Chief Complaint  ?Patient presents with  ? Follow-up  ?  Pt feels like medication is helping with MDD but would like to discuss weight loss as she is not able to lose any weight  ? ? ?HPI ?Sherri Salazar is a 56 y.o. female with past medical history of migraine, MDD and anemia who presents for f/u of her chronic medical conditions. ? ?MDD: She has been feeling better with Celexa now.  Her anhedonia and difficulty in concentration has improved with Celexa now.  She has to work to 1 in the morning at times and has to wake up at 5:30 to get to work.  She works at school currently. She denies any SI or HI currently.  She has tried multiple SSRIs in the past. ? ?Blood test were reviewed and discussed with the patient in detail. ? ?Past Medical History:  ?Diagnosis Date  ? Migraines   ? PONV (postoperative nausea and vomiting)   ? ? ?Past Surgical History:  ?Procedure Laterality Date  ? ABLATION    ? BIOPSY  09/17/2021  ? Procedure: BIOPSY;  Surgeon: Eloise Harman, DO;  Location: [facility];  Service: Endoscopy;;  ? CESAREAN SECTION  2000,2002  ? COLONOSCOPY WITH PROPOFOL N/A 09/17/2021  ? Procedure: COLONOSCOPY WITH PROPOFOL;  Surgeon: Eloise Harman, DO;  Location: [facility];  Service: Endoscopy;  Laterality: N/A;  8:00am  ? VENTRAL HERNIA REPAIR N/A 03/09/2020  ? Procedure: VENTRAL HERNIA REPAIR;  Surgeon: Virl Cagey, MD;  Location: [facility];  Service: General;  Laterality: N/A;  ? ? ?Family History  ?Problem Relation Age of Onset  ? Parkinson's disease Father   ? Colon cancer Neg Hx   ? ? ?Social History  ? ?Socioeconomic History  ? Marital status: Divorced  ?  Spouse name: Not on file  ? Number of children: Not on file  ? Years of education: Not on file  ? Highest education level: Not on file  ?Occupational History  ? Not on file  ?Tobacco Use  ? Smoking  status: Former  ?  Types: Cigarettes  ?  Quit date: 09/29/1985  ?  Years since quitting: 36.3  ? Smokeless tobacco: Never  ? Tobacco comments:  ?  tried but not consistent  ?Vaping Use  ? Vaping Use: Never used  ?Substance and Sexual Activity  ? Alcohol use: No  ? Drug use: No  ? Sexual activity: Not Currently  ?  Birth control/protection: Surgical  ?Other Topics Concern  ? Not on file  ?Social History Narrative  ? 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  ? ?Social Determinants of Health  ? ?Financial Resource Strain: Not on file  ?Food Insecurity: Not on file  ?Transportation Needs: Not on file  ?Physical Activity: Not on file  ?Stress: Stress Concern Present  ? Feeling of Stress : Rather much  ?Social Connections: Not on file  ?Intimate Partner Violence: Not on file  ? ? ?Outpatient Medications Prior to Visit  ?Medication Sig Dispense Refill  ? Aspirin-Acetaminophen-Caffeine (EXCEDRIN MIGRAINE PO) Take 2 tablets by mouth 3 (three) times daily as needed (migraines).     ? citalopram (CELEXA) 10 MG tablet TAKE 1 TABLET BY MOUTH EVERY DAY 90 tablet 2  ? Multiple Vitamins-Minerals (EMERGEN-C IMMUNE PLUS/VIT D) CHEW Chew 2 tablets  by mouth daily.    ? Multiple Vitamins-Minerals (WOMENS MULTI VITAMIN & MINERAL PO) Take 1 tablet by mouth daily.    ? ?No facility-administered medications prior to visit.  ? ? ?Allergies  ?Allergen Reactions  ? Lexapro [Escitalopram] Other (See Comments)  ?  6 mths Cried all  the time.   ? Prozac [Fluoxetine] Other (See Comments)  ?  Suicidal thoughts  ? Zoloft [Sertraline Hcl] Other (See Comments)  ?  Suicidal thoughts  ? Wellbutrin [Bupropion] Hives and Rash  ? Vicodin [Hydrocodone-Acetaminophen] Other (See Comments)  ?  hallucinate  ? ? ?ROS ?Review of Systems  ?Constitutional:  Positive for fatigue. Negative for chills and fever.  ?HENT:  Negative for congestion, sinus pressure, sinus pain and sore throat.   ?Eyes:  Negative for pain  and discharge.  ?Respiratory:  Negative for cough and shortness of breath.   ?Cardiovascular:  Negative for chest pain and palpitations.  ?Gastrointestinal:  Negative for abdominal pain, diarrhea, nausea and vomiting.  ?Endocrine: Negative for polydipsia and polyuria.  ?Genitourinary:  Negative for dysuria and hematuria.  ?Musculoskeletal:  Negative for neck pain and neck stiffness.  ?Skin:  Negative for rash.  ?Neurological:  Negative for dizziness and weakness.  ?Psychiatric/Behavioral:  Positive for sleep disturbance. Negative for agitation and behavioral problems. The patient is nervous/anxious.   ? ?  ?Objective:  ?  ?Physical Exam ?Vitals reviewed.  ?Constitutional:   ?   General: She is not in acute distress. ?   Appearance: She is not diaphoretic.  ?HENT:  ?   Head: Normocephalic and atraumatic.  ?   Nose: Nose normal.  ?   Mouth/Throat:  ?   Mouth: Mucous membranes are moist.  ?Eyes:  ?   General: No scleral icterus. ?   Extraocular Movements: Extraocular movements intact.  ?Cardiovascular:  ?   Rate and Rhythm: Normal rate and regular rhythm.  ?   Pulses: Normal pulses.  ?   Heart sounds: Normal heart sounds. No murmur heard. ?Pulmonary:  ?   Breath sounds: Normal breath sounds. No wheezing or rales.  ?Musculoskeletal:  ?   Cervical back: Neck supple. No tenderness.  ?   Right lower leg: No edema.  ?   Left lower leg: No edema.  ?Skin: ?   General: Skin is warm.  ?   Findings: No rash.  ?Neurological:  ?   General: No focal deficit present.  ?   Mental Status: She is alert and oriented to person, place, and time.  ?   Sensory: No sensory deficit.  ?   Motor: No weakness.  ?Psychiatric:     ?   Mood and Affect: Mood normal.     ?   Behavior: Behavior normal. Behavior is cooperative.     ?   Thought Content: Thought content normal. Thought content does not include homicidal or suicidal ideation.  ? ? ?BP 122/84 (BP Location: Left Arm, Patient Position: Sitting, Cuff Size: Normal)   Pulse 67   Resp 18   Ht  $R'5\' 2"'iz$  (1.575 m)   Wt 163 lb 6.4 oz (74.1 kg)   SpO2 100%   BMI 29.89 kg/m?  ?Wt Readings from Last 3 Encounters:  ?01/15/22 163 lb 6.4 oz (74.1 kg)  ?11/07/21 161 lb 1.3 oz (73.1 kg)  ?09/17/21 155 lb (70.3 kg)  ? ? ?Lab Results  ?Component Value Date  ? TSH 3.190 12/20/2021  ? ?Lab Results  ?Component Value Date  ? WBC 5.2 12/20/2021  ?  HGB 12.4 12/20/2021  ? HCT 37.4 12/20/2021  ? MCV 86 12/20/2021  ? PLT 289 12/20/2021  ? ?Lab Results  ?Component Value Date  ? NA 142 12/20/2021  ? K 4.9 12/20/2021  ? CO2 26 12/20/2021  ? GLUCOSE 94 12/20/2021  ? BUN 18 12/20/2021  ? CREATININE 0.95 12/20/2021  ? BILITOT <0.2 12/20/2021  ? ALKPHOS 71 12/20/2021  ? AST 26 12/20/2021  ? ALT 15 12/20/2021  ? PROT 7.3 12/20/2021  ? ALBUMIN 4.5 12/20/2021  ? CALCIUM 9.8 12/20/2021  ? EGFR 72 12/20/2021  ? ?Lab Results  ?Component Value Date  ? CHOL 163 12/20/2021  ? ?Lab Results  ?Component Value Date  ? HDL 86 12/20/2021  ? ?Lab Results  ?Component Value Date  ? [place] 68 12/20/2021  ? ?Lab Results  ?Component Value Date  ? TRIG 42 12/20/2021  ? ?Lab Results  ?Component Value Date  ? CHOLHDL 1.9 12/20/2021  ? ?Lab Results  ?Component Value Date  ? HGBA1C 5.5 12/20/2021  ? ? ?  ?Assessment & Plan:  ? ?Problem List Items Addressed This Visit   ? ?  ? Other  ? Depression - Primary  ?  [place] Office Visit from 01/15/2022 in [facility]  ?PHQ-9 Total Score 0  ?Has tried multiple SSRIs and Wellbutrin in the past ?Well controlled with Celexa now ?Referred to [facility] therapy ?  ?  ? Overweight  ?  BMI Readings from Last 1 Encounters:  ?01/15/22 29.89 kg/m?  ?Continue to follow low-carb diet and perform moderate exercise/walking at least 150 minutes/week ?Had extensive discussion about diet ?Advised to follow weight loss programs such as weight watchers ? ?  ?  ? ?Other Visit Diagnoses   ? ? Need for varicella vaccine      ? Relevant Orders  ? Varicella-zoster vaccine IM (Shingrix) (Completed)  ? ?  ? ? ?No orders of the  defined types were placed in this encounter. ? ? ?Follow-up: Return in about 6 months (around 07/17/2022) for MDD.  ? ? ?Lindell Spar, MD ?

## 2022-01-15 NOTE — Assessment & Plan Note (Addendum)
[place] Office Visit from 01/15/2022 in [facility]  ?PHQ-9 Total Score 0  ?  ? ?Has tried multiple SSRIs and Wellbutrin in the past ?Well controlled with Celexa now ?Referred to [facility] therapy ?

## 2022-01-15 NOTE — Assessment & Plan Note (Signed)
BMI Readings from Last 1 Encounters:  ?01/15/22 29.89 kg/m?  ? ?Continue to follow low-carb diet and perform moderate exercise/walking at least 150 minutes/week ?Had extensive discussion about diet ?Advised to follow weight loss programs such as weight watchers ? ?

## 2022-01-22 IMAGING — MG MM DIGITAL SCREENING BILAT W/ TOMO AND CAD
6 of 10 series · 6 of 30 positions shown · non-contrast
Comparison: Previous exam(s).

CLINICAL DATA: Screening.

EXAM:
DIGITAL SCREENING BILATERAL MAMMOGRAM WITH TOMOSYNTHESIS AND CAD
TECHNIQUE: Bilateral screening digital craniocaudal and mediolateral oblique
mammograms were obtained. Bilateral screening digital breast
tomosynthesis was performed. The images were evaluated with
computer-aided detection.

[L CC synth-2D]
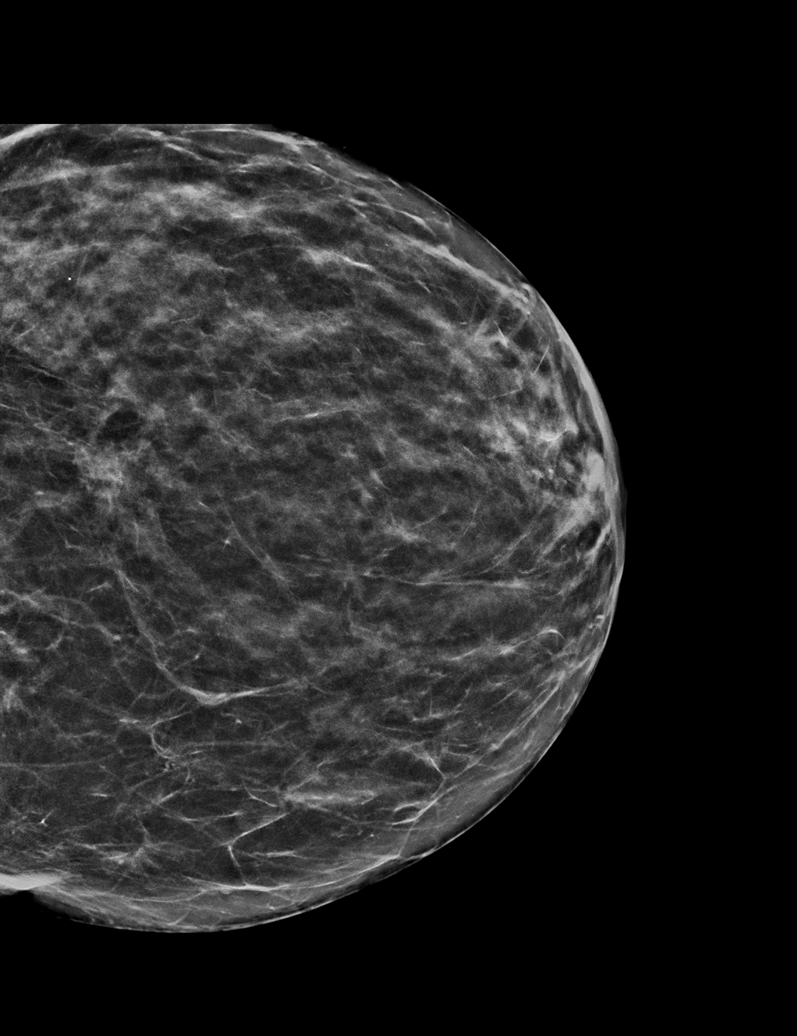

[L MLO synth-2D (1 of 2)]
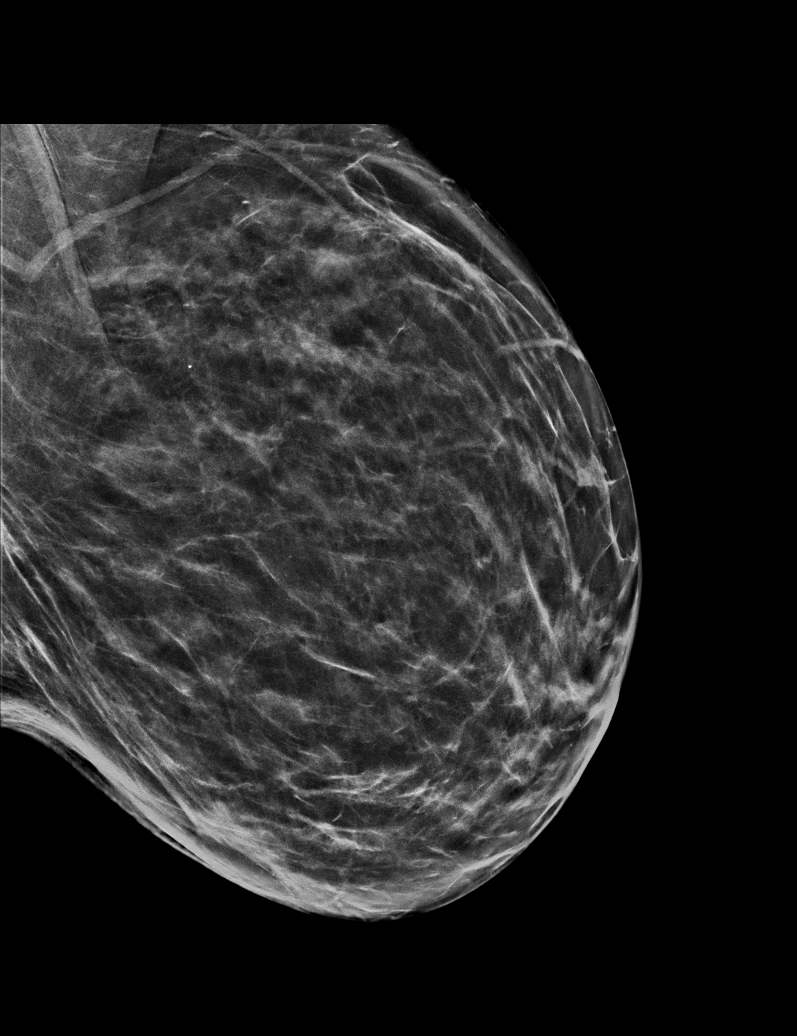

[L MLO synth-2D (2 of 2)]
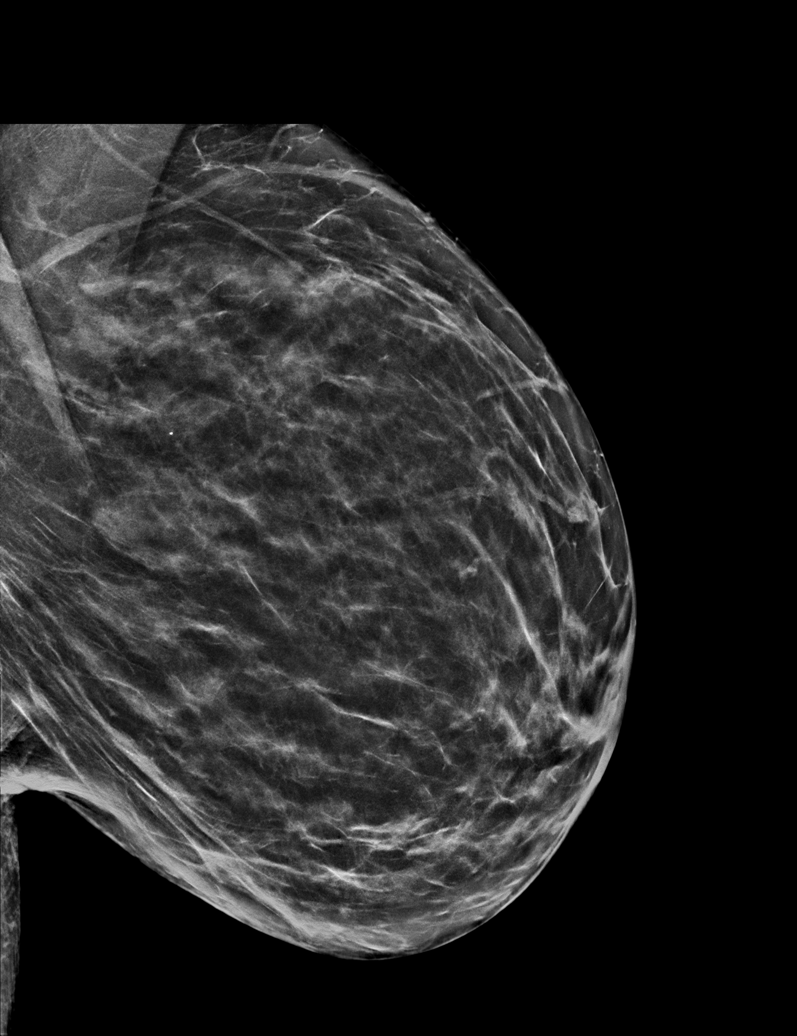

[R CC synth-2D]
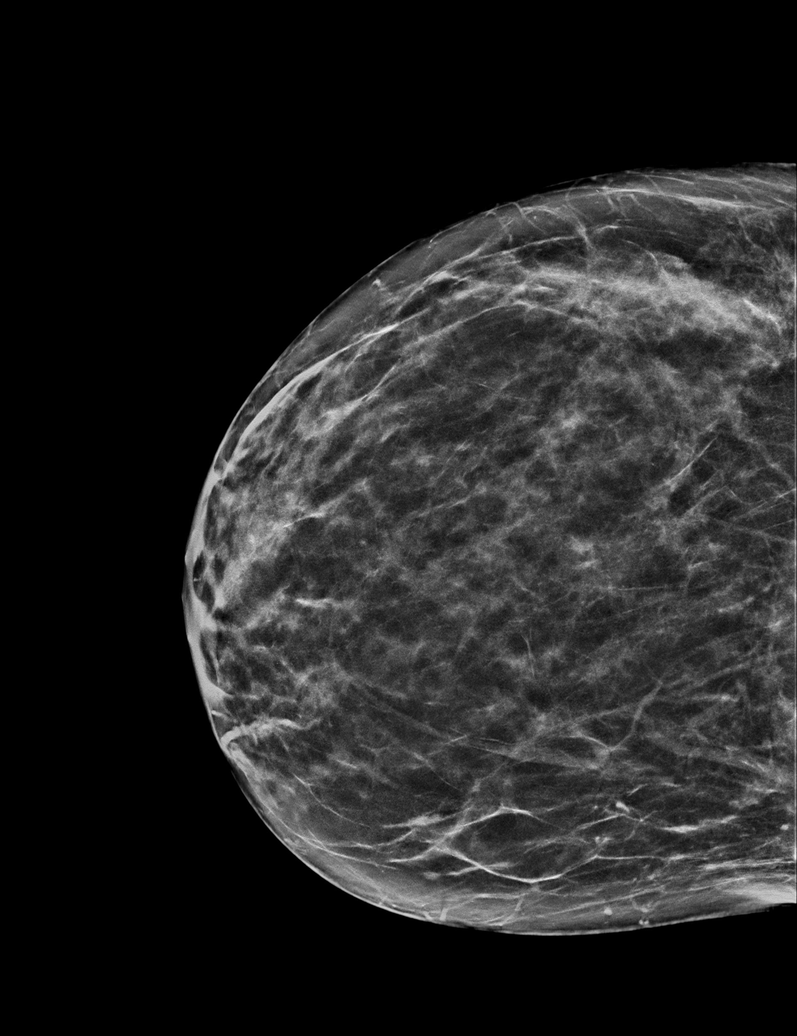

[R MLO synth-2D]
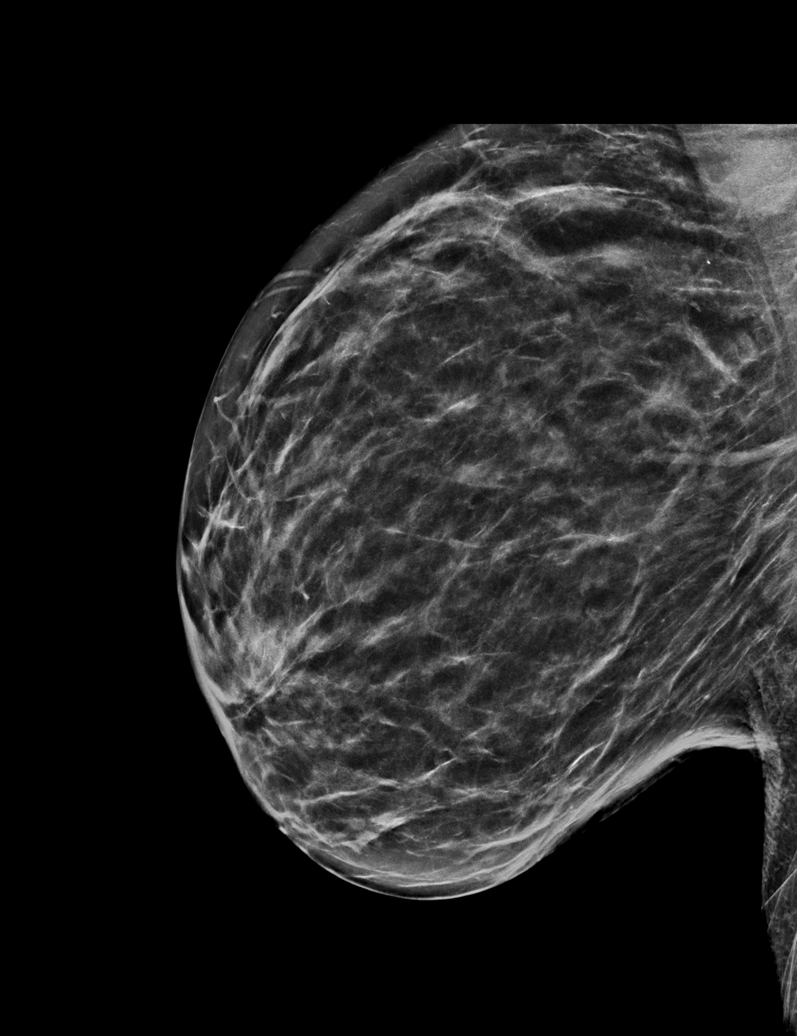

[R CC tomo · tomo slice 29/57.0]
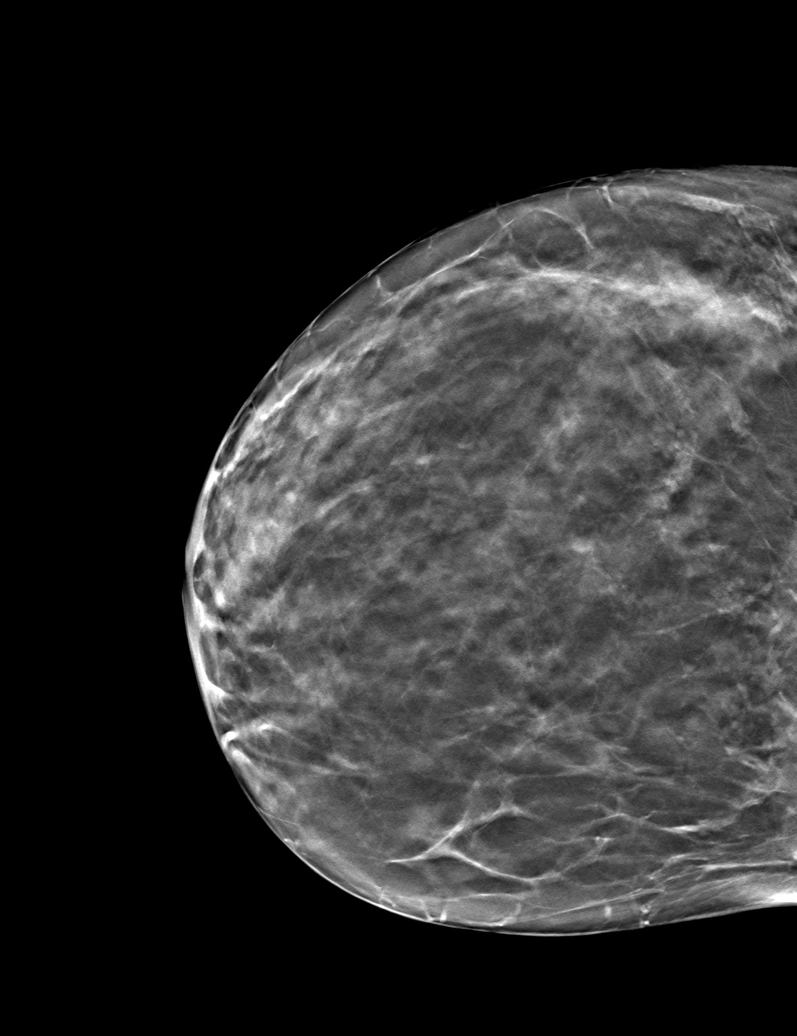

[6 of 30 positions shown; findings below may reference images not displayed]

ACR Breast Density Category c: The breast tissue is heterogeneously
dense, which may obscure small masses.
FINDINGS: There are no findings suspicious for malignancy.
IMPRESSION: No mammographic evidence of malignancy. A result letter of this
screening mammogram will be mailed directly to the patient.

RECOMMENDATION:
Screening mammogram in one year. (Code:Q3-W-BC3)

BI-RADS CATEGORY  1: Negative.

## 2022-02-06 ENCOUNTER — Telehealth: Payer: Self-pay | Admitting: Licensed Clinical Social Worker

## 2022-02-06 ENCOUNTER — Telehealth: Payer: BC Managed Care – PPO | Admitting: Licensed Clinical Social Worker

## 2022-02-06 NOTE — Telephone Encounter (Signed)
Care Management ?Clinical Social Work Note ?  ?02/06/22 ?Name: Sherri Salazar           MRN: [id number]       DOB: 12/31/1965 ?  ?Sherri Salazar is a 56 y.o. year old female who is a primary care patient of Lindell Spar, MD.  The Care Management team was consulted for assistance with chronic disease management and coordination needs. ? ?LCSW called client phone number today. Client was not available to talk via phone with LCSW today.  LCSW is requesting that Care Guide scheduler please re-schedule LCSW phone visit with client ? ?Follow Up Plan:  Care Guide scheduler to re-schedule LCSW phone visit with client ? ?Norva Riffle.Rhealyn Cullen MSW, LCSW ?Licensed Clinical Social Worker ?[facility] ?[phone] ?

## 2022-02-28 ENCOUNTER — Telehealth: Payer: Self-pay | Admitting: *Deleted

## 2022-02-28 NOTE — Chronic Care Management (AMB) (Signed)
  Care Coordination Note  02/28/2022 Name: Sherri Salazar MRN: [id number] DOB: 09/02/1966  Sherri Salazar is a 56 y.o. year old female who is a primary care patient of Lindell Spar, MD and is actively engaged with the care management team. I reached out to [place] by phone today to assist with re-scheduling a follow up visit with the Licensed Clinical Social Worker  Follow up plan: Unsuccessful telephone outreach attempt made. A HIPAA compliant phone message was left for the patient providing contact information and requesting a return call. The care management team will reach out to the patient again over the next 10-14 days. If patient returns call to provider office, please advise to call [place] at [phone].  [facility]  Direct Dial: [phone]

## 2022-03-12 NOTE — Chronic Care Management (AMB) (Signed)
  Care Coordination Note  03/12/2022 Name: Sherri Salazar MRN: [id number] DOB: May 12, 1966  Sherri Salazar is a 56 y.o. year old female who is a primary care patient of Lindell Spar, MD and is actively engaged with the care management team. I reached out to [place] by phone today to assist with re-scheduling a follow up visit with the Licensed Clinical Social Worker  Follow up plan: Unsuccessful telephone outreach attempt made. A HIPAA compliant phone message was left for the patient providing contact information and requesting a return call.  The care management team will reach out to the patient again over the next 7 days.  If patient returns call to provider office, please advise to call [place] at [phone].  [facility]  Direct Dial: [phone]

## 2022-03-18 NOTE — Chronic Care Management (AMB) (Signed)
  Care Coordination Note  03/18/2022 Name: Princesa Willig MRN: [id number] DOB: 08-Mar-1966  Tehillah Cipriani is a 56 y.o. year old female who is a primary care patient of Lindell Spar, MD and is actively engaged with the care management team. I reached out to [place] by phone today to assist with re-scheduling a follow up visit with the Licensed Clinical Social Worker  Follow up plan: Telephone appointment with care management team member scheduled for:03/21/22  [place] [facility]  Direct Dial: [phone]

## 2022-03-20 ENCOUNTER — Telehealth: Payer: Self-pay | Admitting: Internal Medicine

## 2022-03-20 NOTE — Telephone Encounter (Signed)
Patient called in regard to health exam form left at office this am .  Patient was scheduled for appt 6/27 to have forms filled out. Last office visit was 4/19  Wants to see if forms can be filled out without appt, due to no insurance coverage. Patient has been advised appt may be necessary for form completion   Wants a call back

## 2022-03-21 ENCOUNTER — Ambulatory Visit: Payer: BC Managed Care – PPO | Admitting: Licensed Clinical Social Worker

## 2022-03-21 DIAGNOSIS — E663 Overweight: Secondary | ICD-10-CM

## 2022-03-21 DIAGNOSIS — F331 Major depressive disorder, recurrent, moderate: Secondary | ICD-10-CM

## 2022-03-21 DIAGNOSIS — D649 Anemia, unspecified: Secondary | ICD-10-CM

## 2022-03-21 DIAGNOSIS — G43709 Chronic migraine without aura, not intractable, without status migrainosus: Secondary | ICD-10-CM

## 2022-03-21 DIAGNOSIS — E559 Vitamin D deficiency, unspecified: Secondary | ICD-10-CM

## 2022-03-25 ENCOUNTER — Encounter: Payer: Self-pay | Admitting: Internal Medicine

## 2022-03-25 ENCOUNTER — Ambulatory Visit: Payer: BC Managed Care – PPO | Admitting: Internal Medicine

## 2022-03-25 VITALS — BP 118/82 | HR 68 | Resp 18 | Ht 62.0 in | Wt 164.6 lb

## 2022-03-25 DIAGNOSIS — Z23 Encounter for immunization: Secondary | ICD-10-CM | POA: Diagnosis not present

## 2022-03-25 DIAGNOSIS — Z111 Encounter for screening for respiratory tuberculosis: Secondary | ICD-10-CM | POA: Diagnosis not present

## 2022-03-25 DIAGNOSIS — G43709 Chronic migraine without aura, not intractable, without status migrainosus: Secondary | ICD-10-CM

## 2022-03-25 DIAGNOSIS — F331 Major depressive disorder, recurrent, moderate: Secondary | ICD-10-CM

## 2022-03-26 ENCOUNTER — Other Ambulatory Visit (HOSPITAL_COMMUNITY): Payer: Self-pay | Admitting: Adult Health

## 2022-03-26 DIAGNOSIS — Z1231 Encounter for screening mammogram for malignant neoplasm of breast: Secondary | ICD-10-CM

## 2022-03-27 LAB — TB SKIN TEST
Induration: 0 mm
TB Skin Test: NEGATIVE

## 2022-04-25 ENCOUNTER — Ambulatory Visit: Payer: BC Managed Care – PPO | Admitting: Licensed Clinical Social Worker

## 2022-04-25 DIAGNOSIS — D649 Anemia, unspecified: Secondary | ICD-10-CM

## 2022-04-25 DIAGNOSIS — G43709 Chronic migraine without aura, not intractable, without status migrainosus: Secondary | ICD-10-CM

## 2022-04-25 DIAGNOSIS — F331 Major depressive disorder, recurrent, moderate: Secondary | ICD-10-CM

## 2022-04-25 DIAGNOSIS — E559 Vitamin D deficiency, unspecified: Secondary | ICD-10-CM

## 2022-04-25 NOTE — Chronic Care Management (AMB) (Signed)
Care Management Clinical Social Work Note  04/25/2022 Name: Sherri Salazar MRN: [id number] DOB: [birth year]  Sherri Salazar is a 56 y.o. year old female who is a primary care patient of Lindell Spar, MD.  The Care Management team was consulted for assistance with chronic disease management and coordination needs.  Engaged with patient by telephone for follow up visit in response to provider referral for social work chronic care management and care coordination services  Consent to Services:  Ms. [name] was given information about Care Management services today including:  Care Management services includes personalized support from designated clinical staff supervised by her physician, including individualized plan of care and coordination with other care providers 24/7 contact phone numbers for assistance for urgent and routine care needs. The patient may stop case management services at any time by phone call to the office staff.  Patient agreed to services and consent obtained.   Assessment: Review of patient past medical history, allergies, medications, and health status, including review of relevant consultants reports was performed today as part of a comprehensive evaluation and provision of chronic care management and care coordination services.  SDOH (Social Determinants of Health) assessments and interventions performed:  SDOH Interventions    Flowsheet Row Most Recent Value  SDOH Interventions   Stress Interventions Provide Counseling  [client has stress related to managing occasional anxiety issues]  Depression Interventions/Treatment  Counseling, Medication        Advanced Directives Status: See Vynca application for related entries.  Care Plan  Allergies  Allergen Reactions   Lexapro [Escitalopram] Other (See Comments)    6 mths Cried all  the time.    Prozac [Fluoxetine] Other (See Comments)    Suicidal thoughts   Zoloft [Sertraline Hcl] Other (See Comments)     Suicidal thoughts   Wellbutrin [Bupropion] Hives and Rash   Vicodin [Hydrocodone-Acetaminophen] Other (See Comments)    hallucinate    Outpatient Encounter Medications as of 04/25/2022  Medication Sig   Aspirin-Acetaminophen-Caffeine (EXCEDRIN MIGRAINE PO) Take 2 tablets by mouth 3 (three) times daily as needed (migraines).    citalopram (CELEXA) 10 MG tablet TAKE 1 TABLET BY MOUTH EVERY DAY   Multiple Vitamins-Minerals (EMERGEN-C IMMUNE PLUS/VIT D) CHEW Chew 2 tablets by mouth daily.   Multiple Vitamins-Minerals (WOMENS MULTI VITAMIN & MINERAL PO) Take 1 tablet by mouth daily.   No facility-administered encounter medications on file as of 04/25/2022.    Patient Active Problem List   Diagnosis Date Noted   Overweight 01/15/2022   Anemia 11/07/2021   Incarcerated epigastric hernia    Depression 11/09/2019   Ventral hernia without obstruction or gangrene 11/09/2019   Varicose veins of right lower extremity with complications 94/70/9628   Migraines 12/15/2013    Conditions to be addressed/monitored: monitor client management of anxiety issues  Care Plan : LCSW Care Plan  Updates made by Katha Cabal, LCSW since 04/25/2022 12:00 AM     Problem: Emotional Distress      Goal: Emotional Health Supported. Manage depression. Manage anxiety   Start Date: 01/06/2022  Expected End Date: 06/19/2022  This Visit's Progress: On track  Recent Progress: On track  Priority: Medium  Note:   Current Barriers:  Depression issues Financial issues Suicidal Ideation/Homicidal Ideation: No  Clinical Social Work Goal(s):  patient will work with SW monthly by telephone or in person to reduce or manage symptoms related to depression and anxiety Patient will attend scheduled medical appointments in next 30 days Patient will call RNCM  as needed for nursing support Patient will call LCSW as needed in next 30 days for discussion of financial challenges of client  Interventions: 1:1  collaboration with Lindell Spar, MD regarding development ; update of comprehensive plan of care as evidenced by provider attestation and co-signature Discussed client needs with Conley Canal Reviewed medication procurement. Discussed financial challenges with client. 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 She is glad she will have this job opportunity for the Fall. Discussed sleeping issues of client Encouraged client to call RNCM as needed for nursing support Discussed family support. Her two daughters are in college. She  said that her mother is supportive. Her father is currently receiving care at a skilled nursing facility Discussed relaxation techniques of choice. She enjoys reading and exercising. She is walking daily and enjoys walking exercises. Reviewed mood of client. She said she is taking Celexa as prescribed.  She feels that Celexa is helping her mood. She enjoys walking as a form of exercise.She did not mention any mood problems Provided counseling support for client Encouraged client to call LCSW in next 30 days as needed for SW support  Patient Self Care Activities:  Walks as form of exercise Talks with parents regularly Does ADLs on her own Has no transport issues  Patient Coping Strengths:   Family support Worked as Pharmacist, hospital for 23 years. Familiar with job responsibilities  Patient Self Care Deficits:  Depression Anxiety Financial challenges  Patient Goals:  - spend time or talk with others at least 2 to 3 times per week - practice relaxation or meditation daily - keep a calendar with appointment dates  Follow Up Plan:  LCSW to call client on 05/16/22 at 9:00 AM      Sherri Salazar.Sherri Salazar MSW, [place] Holiday representative [facility] Care Management [phone]

## 2022-04-25 NOTE — Patient Instructions (Signed)
Visit Information  Thank you for taking time to visit with me today. Please don't hesitate to contact me if I can be of assistance to you before our next scheduled telephone appointment.  Following are the goals we discussed today:   Our next appointment is by telephone on 05/16/22 at 9:00 AM   Please call the care guide team at [phone] if you need to cancel or reschedule your appointment.   If you are experiencing a Mental Health or [place] or need someone to talk to, please call the [facility]: [phone]   Following is a copy of your full plan of care:  Care Plan : [place]  Updates made by Katha Cabal, LCSW since 04/25/2022 12:00 AM     Problem: Emotional Distress      Goal: Emotional Health Supported. Manage depression. Manage anxiety   Start Date: 01/06/2022  Expected End Date: 06/19/2022  This Visit's Progress: On track  Recent Progress: On track  Priority: Medium  Note:   Current Barriers:  Depression issues Financial issues Suicidal Ideation/Homicidal Ideation: No  Clinical Social Work Goal(s):  patient will work with SW monthly by telephone or in person to reduce or manage symptoms related to depression and anxiety Patient will attend scheduled medical appointments in next 30 days Patient will call RNCM as needed for nursing support Patient will call LCSW as needed in next 30 days for discussion of financial challenges of client  Interventions: 1:1 collaboration with Lindell Spar, MD regarding development ; update of comprehensive plan of care as evidenced by provider attestation and co-signature Discussed client needs with Conley Canal Reviewed medication procurement. Discussed financial challenges with client. 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 She is glad she will have this job opportunity for the Fall. Discussed  sleeping issues of client Encouraged client to call RNCM as needed for nursing support Discussed family support. Her two daughters are in college. She  said that her mother is supportive. Her father is currently receiving care at a skilled nursing facility Discussed relaxation techniques of choice. She enjoys reading and exercising. She is walking daily and enjoys walking exercises. Reviewed mood of client. She said she is taking Celexa as prescribed.  She feels that Celexa is helping her mood. 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 Grills was given information about Care Management services by the embedded care coordination team including:  Care Management services include personalized support from designated clinical staff supervised by her physician, including individualized plan of care and coordination with other care providers 24/7 contact phone numbers for assistance for urgent and routine care  needs. The patient may stop CCM services at any time (effective at the end of the month) by phone call to the office staff.  Patient agreed to services and verbal consent obtained.   Norva Riffle.Namon Villarin MSW, [place] Holiday representative [facility] Care Management [phone]

## 2022-05-12 ENCOUNTER — Ambulatory Visit (HOSPITAL_COMMUNITY)
Admission: RE | Admit: 2022-05-12 | Discharge: 2022-05-12 | Disposition: A | Discharge status: Home / Self Care | Payer: BC Managed Care – PPO | Source: Ambulatory Visit | Attending: Adult Health | Admitting: Adult Health

## 2022-05-12 DIAGNOSIS — Z1231 Encounter for screening mammogram for malignant neoplasm of breast: Secondary | ICD-10-CM | POA: Insufficient documentation

## 2022-05-13 ENCOUNTER — Telehealth: Payer: Self-pay | Admitting: *Deleted

## 2022-05-13 ENCOUNTER — Telehealth: Payer: Self-pay

## 2022-05-13 NOTE — Telephone Encounter (Signed)
Patient called needs to reschedule Friday, 05/16/2022  appointment has to work. Please call patient at [phone] to reschedule appointment.

## 2022-05-13 NOTE — Chronic Care Management (AMB) (Signed)
  Care Coordination Note  05/13/2022 Name: Sherri Salazar MRN: [id number] DOB: [birth year]  Sherri Salazar is a 56 y.o. year old female who is a primary care patient of Lindell Spar, MD and is actively engaged with the care management team. I reached out to Conley Canal by phone today to assist with re-scheduling a follow up visit with the Licensed Clinical Social Worker  Follow up plan: Unsuccessful telephone outreach attempt made. A HIPAA compliant phone message was left for the patient providing contact information and requesting a return call.  The care management team will reach out to the patient again over the next 7 days.  If patient returns call to provider office, please advise to call [place] at [phone].  [place]  Direct Dial: [phone]

## 2022-05-16 ENCOUNTER — Telehealth: Payer: BC Managed Care – PPO

## 2022-05-20 NOTE — Chronic Care Management (AMB) (Signed)
  Care Coordination Note  05/20/2022 Name: [name] MRN: [id number] DOB: 1966-04-28  Sherri Salazar is a 56 y.o. year old female who is a primary care patient of Lindell Spar, MD and is actively engaged with the care management team. I reached out to Conley Canal by phone today to assist with re-scheduling a follow up visit with the Licensed Clinical Social Worker  Follow up plan: Unsuccessful telephone outreach attempt made. A HIPAA compliant phone message was left for the patient providing contact information and requesting a return call. We have been unable to make contact with the patient for follow up. The care management team is available to follow up with the patient after provider conversation with the patient regarding recommendation for care management engagement and subsequent re-referral to the care management team.   [place]  Direct Dial: [phone]

## 2022-07-17 ENCOUNTER — Ambulatory Visit: Payer: BC Managed Care – PPO | Admitting: Internal Medicine

## 2022-08-06 ENCOUNTER — Ambulatory Visit: Payer: BC Managed Care – PPO | Admitting: Family Medicine

## 2022-08-06 ENCOUNTER — Encounter: Payer: Self-pay | Admitting: Family Medicine

## 2022-08-06 VITALS — BP 123/71 | HR 70 | Ht 62.0 in | Wt 159.0 lb

## 2022-08-06 DIAGNOSIS — Z23 Encounter for immunization: Secondary | ICD-10-CM | POA: Diagnosis not present

## 2022-08-06 DIAGNOSIS — M79602 Pain in left arm: Secondary | ICD-10-CM

## 2022-08-06 DIAGNOSIS — E538 Deficiency of other specified B group vitamins: Secondary | ICD-10-CM

## 2022-08-06 DIAGNOSIS — R0789 Other chest pain: Secondary | ICD-10-CM | POA: Diagnosis not present

## 2022-08-06 MED ORDER — CYCLOBENZAPRINE HCL 5 MG PO TABS
5.0000 mg | ORAL_TABLET | Freq: Three times a day (TID) | ORAL | 1 refills | Status: DC | PRN
Start: 2022-08-06 — End: 2024-04-12

## 2022-08-06 NOTE — Patient Instructions (Signed)
I appreciate the opportunity to provide care to you today!    Follow up:  with Dr. Posey Pronto    Please pick up your medications at the pharmacy.    Please continue to a heart-healthy diet and increase your physical activities. Try to exercise for 29mns at least three times a week.      It was a pleasure to see you and I look forward to continuing to work together on your health and well-being. Please do not hesitate to call the office if you need care or have questions about your care.   Have a wonderful day and week. With Gratitude, GAlvira MondayMSN, FNP-BC

## 2022-08-06 NOTE — Assessment & Plan Note (Signed)
Patient educated on CDC recommendation for the vaccine. Verbal consent was obtained from the patient, vaccine administered by nurse, no sign of adverse reactions noted at this time. Patient education on arm soreness and use of tylenol or ibuprofen for this patient  was discussed. Patient educated on the signs and symptoms of adverse effect and advise to contact the office if they occur.  

## 2022-08-06 NOTE — Assessment & Plan Note (Signed)
Inform the patient that her pain is musculoskeletal in origin Inform the patient that her rib pain is likely from excessive coughing 2 weeks ago Inform the patient her symptoms.  Will subside in a few weeks with conservative measures Encouraged to take Flexeril as needed for musculoskeletal pain and Tylenol as needed for pain control

## 2022-08-06 NOTE — Progress Notes (Signed)
Acute Office Visit  Subjective:     Patient ID: Sherri Salazar, female    DOB: [birth year], 56 y.o.   MRN: [id number]  Chief Complaint  Patient presents with   Arm Pain    Pt states last night around 10 pm she started feeling left arm pain,  this morning around 4:15 am she was experiencing left arm pain and weakness, felt stabbing pain in her collar bone and had left arm pain and pain also in ribs, also had numb and tingling feeling in her fingers, she called EMT and they did an EKG she has report with her to have reviewed.     HPI  The patient is in today with reports of an upper respiratory infection 2 weeks ago, with a cough that lasted for 2 weeks.  She reports waking up this morning with increased left shoulder pain and left rib pain.  She notes that she was documenting her home and lifted a box of less than 50 pounds of ornaments. She complains of numbness and tingling in the left arm that has resolved.  EMT was called on 10/06/21, as she felt that she was having symptoms of myocardial infarction.  EKG showed normal sinus rhythm without ST elevation.  Review of Systems  Constitutional:  Negative for chills and weight loss.  Respiratory:  Negative for cough and shortness of breath.   Cardiovascular:  Negative for chest pain, palpitations, orthopnea and leg swelling.  Gastrointestinal:  Negative for constipation.  Neurological:  Negative for dizziness and headaches.        Objective:    BP 123/71   Pulse 70   Ht '5\' 2"'$  (1.575 m)   Wt 159 lb 0.6 oz (72.1 kg)   SpO2 95%   BMI 29.09 kg/m    Physical Exam HENT:     Head: Normocephalic.  Cardiovascular:     Rate and Rhythm: Normal rate and regular rhythm.     Heart sounds: Normal heart sounds.  Pulmonary:     Effort: Pulmonary effort is normal.     Breath sounds: Normal breath sounds.  Musculoskeletal:     Comments: Mild tenderness with palpation of the left ribs  Neurological:     Mental Status: She is alert.     No  results found for any visits on 08/06/22.      Assessment & Plan:   Problem List Items Addressed This Visit       Other   Costochondral pain - Primary    Inform the patient that her pain is musculoskeletal in origin Inform the patient that her rib pain is likely from excessive coughing 2 weeks ago Inform the patient her symptoms.  Will subside in a few weeks with conservative measures Encouraged to take Flexeril as needed for musculoskeletal pain and Tylenol as needed for pain control          Need for immunization against influenza    Patient educated on CDC recommendation for the vaccine. Verbal consent was obtained from the patient, vaccine administered by nurse, no sign of adverse reactions noted at this time. Patient education on arm soreness and use of tylenol or ibuprofen for this patient  was discussed. Patient educated on the signs and symptoms of adverse effect and advise to contact the office if they occur.       Other Visit Diagnoses     Flu vaccine need       Relevant Orders   Flu Vaccine QUAD 6+ mos PF  IM (Fluarix Quad PF) (Completed)   Vitamin B12 deficiency       Relevant Orders   Vitamin B12   Left arm pain       Relevant Medications   cyclobenzaprine (FLEXERIL) 5 MG tablet       Meds ordered this encounter  Medications   cyclobenzaprine (FLEXERIL) 5 MG tablet    Sig: Take 1 tablet (5 mg total) by mouth 3 (three) times daily as needed for muscle spasms.    Dispense:  30 tablet    Refill:  1    Return if symptoms worsen or fail to improve.  Alvira Monday, FNP

## 2022-08-29 ENCOUNTER — Other Ambulatory Visit: Payer: Self-pay | Admitting: Internal Medicine

## 2022-08-29 DIAGNOSIS — F331 Major depressive disorder, recurrent, moderate: Secondary | ICD-10-CM

## 2022-09-03 ENCOUNTER — Ambulatory Visit: Payer: BC Managed Care – PPO | Admitting: Internal Medicine

## 2022-11-12 ENCOUNTER — Ambulatory Visit: Payer: BC Managed Care – PPO | Admitting: Internal Medicine

## 2022-11-12 ENCOUNTER — Encounter: Payer: Self-pay | Admitting: Internal Medicine

## 2022-11-12 VITALS — BP 111/75 | HR 69 | Ht 62.0 in | Wt 168.4 lb

## 2022-11-12 DIAGNOSIS — E559 Vitamin D deficiency, unspecified: Secondary | ICD-10-CM | POA: Diagnosis not present

## 2022-11-12 DIAGNOSIS — E782 Mixed hyperlipidemia: Secondary | ICD-10-CM

## 2022-11-12 DIAGNOSIS — R7303 Prediabetes: Secondary | ICD-10-CM | POA: Diagnosis not present

## 2022-11-12 DIAGNOSIS — E669 Obesity, unspecified: Secondary | ICD-10-CM | POA: Diagnosis not present

## 2022-11-12 DIAGNOSIS — F3341 Major depressive disorder, recurrent, in partial remission: Secondary | ICD-10-CM | POA: Diagnosis not present

## 2022-11-12 DIAGNOSIS — G43709 Chronic migraine without aura, not intractable, without status migrainosus: Secondary | ICD-10-CM

## 2022-11-12 MED ORDER — PHENTERMINE HCL 37.5 MG PO TABS: 18.7500 mg | ORAL_TABLET | Freq: Every day | ORAL | 0 refills | Status: DC

## 2022-11-12 NOTE — Progress Notes (Signed)
Established Patient Office Visit  Subjective:  Patient ID: Sherri Salazar, female    DOB: 05-Feb-1966  Age: 57 y.o. MRN: [id number]  CC:  Chief Complaint  Patient presents with   Weight Management Screening    Patient is wanting to lose weight, she joined a gym and is only eating 1800 calories and is not seeing results    HPI Sherri Salazar is a 57 y.o. female with past medical history of migraine, MDD and anemia who presents for f/u of her chronic medical conditions.  MDD: She has been feeling better with Celexa now.  Her anhedonia and difficulty in concentration has improved with Celexa now. She used to to work till 1 AM in the morning at times and has to wake up at 5:30 to get to work.  She works at school currently. She denies any SI or HI currently.  She has tried multiple SSRIs in the past.  Obesity: She has been gaining weight despite trying low low-carb diet.  She has tried intermittent fasting, but could not continue it.  She has been following weight watchers and has joined a gym.  She walks about 3 to 4 miles per day.  She has gained about 4 lbs since the last visit.  Past Medical History:  Diagnosis Date   Migraines    PONV (postoperative nausea and vomiting)     Past Surgical History:  Procedure Laterality Date   ABLATION     BIOPSY  09/17/2021   Procedure: BIOPSY;  Surgeon: Eloise Harman, DO;  [facility];  Service: Endoscopy;;   CESAREAN SECTION  2000,2002   COLONOSCOPY WITH PROPOFOL N/A 09/17/2021   Procedure: COLONOSCOPY WITH PROPOFOL;  Surgeon: Eloise Harman, DO;  Location: [facility];  Service: Endoscopy;  Laterality: N/A;  8:00am   VENTRAL HERNIA REPAIR N/A 03/09/2020   Procedure: VENTRAL HERNIA REPAIR;  Surgeon: Virl Cagey, MD;  Location: [facility];  Service: General;  Laterality: N/A;    Family History  Problem Relation Age of Onset   Parkinson's disease Father    Colon cancer Neg Hx     Social History   Socioeconomic  History   Marital status: Divorced    Spouse name: Not on file   Number of children: Not on file   Years of education: Not on file   Highest education level: Not on file  Occupational History   Not on file  Tobacco Use   Smoking status: Former    Types: Cigarettes    Quit date: 09/29/1985    Years since quitting: 37.1   Smokeless tobacco: Never   Tobacco comments:    tried but not consistent  Vaping Use   Vaping Use: Never used  Substance and Sexual Activity   Alcohol use: No   Drug use: No   Sexual activity: Not Currently    Birth control/protection: Surgical  Other Topics Concern   Not on file  Social History Narrative   Divorced since April 2022,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   Social Determinants of Health   Financial Resource Strain: Low Risk  (12/10/2020)   Overall Financial Resource Strain (CARDIA)    Difficulty of Paying Living Expenses: Not very hard  Food Insecurity: Food Insecurity Present (12/10/2020)   Hunger Vital Sign    Worried About Running Out of Food in the Last Year: Sometimes true    Ran Out of Food in the Last Year: Never true  Transportation Needs: No  Transportation Needs (12/10/2020)   PRAPARE - Hydrologist (Medical): No    Lack of Transportation (Non-Medical): No  Physical Activity: Sufficiently Active (12/10/2020)   Exercise Vital Sign    Days of Exercise per Week: 5 days    Minutes of Exercise per Session: 60 min  Stress: Stress Concern Present (04/25/2022)   [name]    Feeling of Stress : To some extent  Social Connections: Moderately Isolated (12/10/2020)   Social Connection and Isolation Panel [NHANES]    Frequency of Communication with Friends and Family: More than three times a week    Frequency of Social Gatherings with Friends and Family: Three times a week    Attends Religious Services: More than 4 times  per year    Active Member of Clubs or Organizations: No    Attends Archivist Meetings: Never    Marital Status: Separated  Intimate Partner Violence: Not At Risk (12/10/2020)   Humiliation, Afraid, Rape, and Kick questionnaire    Fear of Current or Ex-Partner: No    Emotionally Abused: No    Physically Abused: No    Sexually Abused: No    Outpatient Medications Prior to Visit  Medication Sig Dispense Refill   Aspirin-Acetaminophen-Caffeine (EXCEDRIN MIGRAINE PO) Take 2 tablets by mouth 3 (three) times daily as needed (migraines).      citalopram (CELEXA) 10 MG tablet TAKE 1 TABLET BY MOUTH EVERY DAY 90 tablet 2   cyclobenzaprine (FLEXERIL) 5 MG tablet Take 1 tablet (5 mg total) by mouth 3 (three) times daily as needed for muscle spasms. 30 tablet 1   Multiple Vitamins-Minerals (EMERGEN-C IMMUNE PLUS/VIT D) CHEW Chew 2 tablets by mouth daily.     Multiple Vitamins-Minerals (WOMENS MULTI VITAMIN & MINERAL PO) Take 1 tablet by mouth daily.     No facility-administered medications prior to visit.    Allergies  Allergen Reactions   Lexapro [Escitalopram] Other (See Comments)    6 mths Cried all  the time.    Prozac [Fluoxetine] Other (See Comments)    Suicidal thoughts   Zoloft [Sertraline Hcl] Other (See Comments)    Suicidal thoughts   Wellbutrin [Bupropion] Hives and Rash   Vicodin [Hydrocodone-Acetaminophen] Other (See Comments)    hallucinate    ROS Review of Systems  Constitutional:  Positive for fatigue. Negative for chills and fever.  HENT:  Negative for congestion, sinus pressure, sinus pain and sore throat.   Eyes:  Negative for pain and discharge.  Respiratory:  Negative for cough and shortness of breath.   Cardiovascular:  Negative for chest pain and palpitations.  Gastrointestinal:  Negative for abdominal pain, diarrhea, nausea and vomiting.  Endocrine: Negative for polydipsia and polyuria.  Genitourinary:  Negative for dysuria and hematuria.   Musculoskeletal:  Negative for neck pain and neck stiffness.  Skin:  Negative for rash.  Neurological:  Negative for dizziness and weakness.  Psychiatric/Behavioral:  Positive for sleep disturbance. Negative for agitation and behavioral problems. The patient is nervous/anxious.       Objective:    Physical Exam Vitals reviewed.  Constitutional:      General: She is not in acute distress.    Appearance: She is not diaphoretic.  HENT:     Head: Normocephalic and atraumatic.     Nose: Nose normal.     Mouth/Throat:     Mouth: Mucous membranes are moist.  Eyes:     General: No  scleral icterus.    Extraocular Movements: Extraocular movements intact.  Cardiovascular:     Rate and Rhythm: Normal rate and regular rhythm.     Pulses: Normal pulses.     Heart sounds: Normal heart sounds. No murmur heard. Pulmonary:     Breath sounds: Normal breath sounds. No wheezing or rales.  Musculoskeletal:     Cervical back: Neck supple. No tenderness.     Right lower leg: No edema.     Left lower leg: No edema.  Skin:    General: Skin is warm.     Findings: No rash.  Neurological:     General: No focal deficit present.     Mental Status: She is alert and oriented to person, place, and time.     Sensory: No sensory deficit.     Motor: No weakness.  Psychiatric:        Mood and Affect: Mood normal.        Behavior: Behavior normal. Behavior is cooperative.        Thought Content: Thought content normal. Thought content does not include homicidal or suicidal ideation.     BP 111/75 (BP Location: Right Arm, Patient Position: Sitting, Cuff Size: Large)   Pulse 69   Ht 5' 2"$  (1.575 m)   Wt 168 lb 6.4 oz (76.4 kg)   SpO2 98%   BMI 30.80 kg/m  Wt Readings from Last 3 Encounters:  11/12/22 168 lb 6.4 oz (76.4 kg)  08/06/22 159 lb 0.6 oz (72.1 kg)  03/25/22 164 lb 9.6 oz (74.7 kg)    Lab Results  Component Value Date   TSH 3.190 12/20/2021   Lab Results  Component Value Date    WBC 5.2 12/20/2021   HGB 12.4 12/20/2021   HCT 37.4 12/20/2021   MCV 86 12/20/2021   PLT 289 12/20/2021   Lab Results  Component Value Date   NA 142 12/20/2021   K 4.9 12/20/2021   CO2 26 12/20/2021   GLUCOSE 94 12/20/2021   BUN 18 12/20/2021   CREATININE 0.95 12/20/2021   BILITOT <0.2 12/20/2021   ALKPHOS 71 12/20/2021   AST 26 12/20/2021   ALT 15 12/20/2021   PROT 7.3 12/20/2021   ALBUMIN 4.5 12/20/2021   CALCIUM 9.8 12/20/2021   EGFR 72 12/20/2021   Lab Results  Component Value Date   CHOL 163 12/20/2021   Lab Results  Component Value Date   HDL 86 12/20/2021   Lab Results  Component Value Date   LDLCALC 68 12/20/2021   Lab Results  Component Value Date   TRIG 42 12/20/2021   Lab Results  Component Value Date   CHOLHDL 1.9 12/20/2021   Lab Results  Component Value Date   HGBA1C 5.5 12/20/2021      Assessment & Plan:   Problem List Items Addressed This Visit       Cardiovascular and Mediastinum   Migraines    Well-controlled with Excedrin migraine as needed        Other   Depression    Flowsheet Row Office Visit from 08/06/2022 in [facility]  PHQ-9 Total Score 0     Has tried multiple SSRIs and Wellbutrin in the past Well controlled with Celexa now Followed by [facility] therapy      Relevant Orders   TSH   CMP14+EGFR   Obesity (BMI 30-39.9) - Primary    BMI Readings from Last 1 Encounters:  11/12/22 30.80 kg/m  Had extensive discussion  about diet and medical weight loss options Started Phentermine - half tablet QD Continue to follow low-carb diet and perform moderate exercise/walking at least 150 minutes/week Advised to follow weight loss programs such as weight watchers      Relevant Medications   phentermine (ADIPEX-P) 37.5 MG tablet   Other Relevant Orders   CMP14+EGFR   CBC with Differential/Platelet   Prediabetes    Lab Results  Component Value Date   HGBA1C 5.5 12/20/2021  Advised to follow low carb  diet for now      Relevant Orders   Hemoglobin A1c   Other Visit Diagnoses     Vitamin D deficiency       Relevant Orders   VITAMIN D 25 Hydroxy (Vit-D Deficiency, Fractures)   Mixed hyperlipidemia       Relevant Orders   Lipid panel       Meds ordered this encounter  Medications   phentermine (ADIPEX-P) 37.5 MG tablet    Sig: Take 0.5 tablets (18.75 mg total) by mouth daily before breakfast.    Dispense:  30 tablet    Refill:  0    Follow-up: Return in about 2 months (around 01/11/2023) for Annual physical.    Lindell Spar, MD

## 2022-11-12 NOTE — Patient Instructions (Signed)
Please start taking Phentermine as prescribed.  Please continue to follow low carb diet and perform moderate exercise/walking at least 150 mins/week.  Please get fasting blood tests done before the next visit.

## 2022-11-12 NOTE — Assessment & Plan Note (Signed)
Well-controlled with Excedrin migraine as needed

## 2022-11-12 NOTE — Assessment & Plan Note (Signed)
[place] Office Visit from 08/06/2022 in [facility]  PHQ-9 Total Score 0      Has tried multiple SSRIs and Wellbutrin in the past Well controlled with Celexa now Followed by [facility] therapy

## 2022-11-12 NOTE — Assessment & Plan Note (Signed)
BMI Readings from Last 1 Encounters:  11/12/22 30.80 kg/m   Had extensive discussion about diet and medical weight loss options Started Phentermine - half tablet QD Continue to follow low-carb diet and perform moderate exercise/walking at least 150 minutes/week Advised to follow weight loss programs such as weight watchers

## 2022-11-12 NOTE — Assessment & Plan Note (Signed)
Lab Results  Component Value Date   HGBA1C 5.5 12/20/2021   Advised to follow low carb diet for now

## 2023-01-13 ENCOUNTER — Encounter: Payer: BC Managed Care – PPO | Admitting: Internal Medicine

## 2023-01-14 ENCOUNTER — Encounter: Payer: BC Managed Care – PPO | Admitting: Internal Medicine

## 2023-01-21 ENCOUNTER — Encounter: Payer: BC Managed Care – PPO | Admitting: Internal Medicine

## 2023-03-06 LAB — CBC WITH DIFFERENTIAL/PLATELET
Basophils Absolute: 0.1 10*3/uL (ref 0.0–0.2)
Basos: 1 %
EOS (ABSOLUTE): 0.1 10*3/uL (ref 0.0–0.4)
Eos: 2 %
Hematocrit: 35.9 % (ref 34.0–46.6)
Hemoglobin: 11.9 g/dL (ref 11.1–15.9)
Immature Grans (Abs): 0 10*3/uL (ref 0.0–0.1)
Immature Granulocytes: 0 %
Lymphocytes Absolute: 1.9 10*3/uL (ref 0.7–3.1)
Lymphs: 35 %
MCH: 28 pg (ref 26.6–33.0)
MCHC: 33.1 g/dL (ref 31.5–35.7)
MCV: 85 fL (ref 79–97)
Monocytes Absolute: 0.4 10*3/uL (ref 0.1–0.9)
Monocytes: 8 %
Neutrophils Absolute: 2.8 10*3/uL (ref 1.4–7.0)
Neutrophils: 54 %
Platelets: 328 10*3/uL (ref 150–450)
RBC: 4.25 x10E6/uL (ref 3.77–5.28)
RDW: 13.3 % (ref 11.7–15.4)
WBC: 5.3 10*3/uL (ref 3.4–10.8)

## 2023-03-06 LAB — CMP14+EGFR
ALT: 16 IU/L (ref 0–32)
AST: 31 IU/L (ref 0–40)
Albumin/Globulin Ratio: 1.7 (ref 1.2–2.2)
Albumin: 4.4 g/dL (ref 3.8–4.9)
Alkaline Phosphatase: 79 IU/L (ref 44–121)
BUN/Creatinine Ratio: 24 — ABNORMAL HIGH (ref 9–23)
BUN: 23 mg/dL (ref 6–24)
Bilirubin Total: <0.2 mg/dL (ref 0.0–1.2)
CO2: 22 mmol/L (ref 20–29)
Calcium: 9.2 mg/dL (ref 8.7–10.2)
Chloride: 106 mmol/L (ref 96–106)
Creatinine, Ser: 0.95 mg/dL (ref 0.57–1.00)
Globulin, Total: 2.6 g/dL (ref 1.5–4.5)
Glucose: 96 mg/dL (ref 70–99)
Potassium: 5.5 mmol/L — ABNORMAL HIGH (ref 3.5–5.2)
Sodium: 141 mmol/L (ref 134–144)
Total Protein: 7 g/dL (ref 6.0–8.5)
eGFR: 71 mL/min/{1.73_m2} (ref >59)

## 2023-03-06 LAB — LIPID PANEL
Chol/HDL Ratio: 2 ratio (ref 0.0–4.4)
Cholesterol, Total: 160 mg/dL (ref 100–199)
HDL: 79 mg/dL (ref >39)
LDL Chol Calc (NIH): 72 mg/dL (ref 0–99)
Triglycerides: 41 mg/dL (ref 0–149)
VLDL Cholesterol Cal: 9 mg/dL (ref 5–40)

## 2023-03-06 LAB — TSH: TSH: 2.43 u[IU]/mL (ref 0.450–4.500)

## 2023-03-06 LAB — VITAMIN D 25 HYDROXY (VIT D DEFICIENCY, FRACTURES): Vit D, 25-Hydroxy: 36.1 ng/mL (ref 30.0–100.0)

## 2023-03-06 LAB — HEMOGLOBIN A1C
Est. average glucose Bld gHb Est-mCnc: 114 mg/dL
Hgb A1c MFr Bld: 5.6 % (ref 4.8–5.6)

## 2023-03-09 ENCOUNTER — Encounter: Payer: Self-pay | Admitting: Internal Medicine

## 2023-03-09 ENCOUNTER — Ambulatory Visit (INDEPENDENT_AMBULATORY_CARE_PROVIDER_SITE_OTHER): Payer: BC Managed Care – PPO | Admitting: Internal Medicine

## 2023-03-09 VITALS — BP 122/75 | HR 79 | Ht 62.0 in | Wt 166.0 lb

## 2023-03-09 DIAGNOSIS — F410 Panic disorder [episodic paroxysmal anxiety] without agoraphobia: Secondary | ICD-10-CM | POA: Insufficient documentation

## 2023-03-09 DIAGNOSIS — H6121 Impacted cerumen, right ear: Secondary | ICD-10-CM | POA: Diagnosis not present

## 2023-03-09 DIAGNOSIS — F331 Major depressive disorder, recurrent, moderate: Secondary | ICD-10-CM

## 2023-03-09 DIAGNOSIS — Z0001 Encounter for general adult medical examination with abnormal findings: Secondary | ICD-10-CM | POA: Diagnosis not present

## 2023-03-09 MED ORDER — ALPRAZOLAM 0.25 MG PO TABS: 0.2500 mg | ORAL_TABLET | Freq: Two times a day (BID) | ORAL | 0 refills | Status: DC | PRN

## 2023-03-09 MED ORDER — CITALOPRAM HYDROBROMIDE 10 MG PO TABS: 15.0000 mg | ORAL_TABLET | Freq: Every day | ORAL | 1 refills | Status: DC

## 2023-03-09 NOTE — Assessment & Plan Note (Signed)
Flowsheet Row Office Visit from 03/09/2023 in [facility]  PHQ-9 Total Score 5      Has tried multiple SSRIs and Wellbutrin in the past Uncontrolled with Celexa 10 mg QD now Increased dose of Celexa to 15 mg QD Followed by [facility] therapy

## 2023-03-09 NOTE — Assessment & Plan Note (Signed)
Physical exam as documented. Fasting blood tests reviewed and discussed with the patient in detail today.

## 2023-03-09 NOTE — Progress Notes (Signed)
Established Patient Office Visit  Subjective:  Patient ID: Sherri Salazar, female    DOB: 07/02/1966  Age: 56 y.o. MRN: [id number]  CC:  Chief Complaint  Patient presents with   Annual Exam    Patient has been trying to lose weight but is unsuccessful. Patient is feeling overwhelmed and has had two panic attacks in the month of June.     HPI Sherri Salazar is a 57 y.o. female with past medical history of migraine, MDD and anemia who presents for annual physical.  Obesity: She was given phentermine for weight loss, but could not continue it.  She has been trying to follow low-carb diet and exercise regularly.  MDD and GAD/panic disorder: She has been feeling overwhelmed and has had panic episodes at work.  She had to leave early in the last week due to panic episode during a meeting.  She has been stressed as she lives alone and has a rented place.  She was doing well with Celexa 10 mg initially.  She denies crying spells currently. She works at school currently. She denies any SI or HI currently.  She has tried multiple SSRIs in the past.  She reports right-sided hearing difficulty for the last 4 weeks.  Denies any ear pain or discharge currently.  Past Medical History:  Diagnosis Date   Migraines    PONV (postoperative nausea and vomiting)     Past Surgical History:  Procedure Laterality Date   ABLATION     BIOPSY  09/17/2021   Procedure: BIOPSY;  Surgeon: Lanelle Bal, DO;  [facility];  Service: Endoscopy;;   CESAREAN SECTION  2000,2002   COLONOSCOPY WITH PROPOFOL N/A 09/17/2021   Procedure: COLONOSCOPY WITH PROPOFOL;  Surgeon: Lanelle Bal, DO;  [facility];  Service: Endoscopy;  Laterality: N/A;  8:00am   VENTRAL HERNIA REPAIR N/A 03/09/2020   Procedure: VENTRAL HERNIA REPAIR;  Surgeon: Lucretia Roers, MD;  Location: [facility];  Service: General;  Laterality: N/A;    Family History  Problem Relation Age of Onset   Parkinson's disease  Father    Colon cancer Neg Hx     Social History   Socioeconomic History   Marital status: Divorced    Spouse name: Not on file   Number of children: Not on file   Years of education: Not on file   Highest education level: Not on file  Occupational History   Not on file  Tobacco Use   Smoking status: Former    Types: Cigarettes    Quit date: 09/29/1985    Years since quitting: 37.4   Smokeless tobacco: Never   Tobacco comments:    tried but not consistent  Vaping Use   Vaping Use: Never used  Substance and Sexual Activity   Alcohol use: No   Drug use: No   Sexual activity: Not Currently    Birth control/protection: Surgical  Other Topics Concern   Not on file  Social History Narrative   Divorced since April 2022,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   Social Determinants of Health   Financial Resource Strain: Low Risk  (12/10/2020)   Overall Financial Resource Strain (CARDIA)    Difficulty of Paying Living Expenses: Not very hard  Food Insecurity: Food Insecurity Present (12/10/2020)   Hunger Vital Sign    Worried About Running Out of Food in the Last Year: Sometimes true    Ran Out of Food in the Last Year:  Never true  Transportation Needs: No Transportation Needs (12/10/2020)   PRAPARE - Administrator, Civil Service (Medical): No    Lack of Transportation (Non-Medical): No  Physical Activity: Sufficiently Active (12/10/2020)   Exercise Vital Sign    Days of Exercise per Week: 5 days    Minutes of Exercise per Session: 60 min  Stress: Stress Concern Present (04/25/2022)   [employer] of Occupational Health - Occupational Stress Questionnaire    Feeling of Stress : To some extent  Social Connections: Moderately Isolated (12/10/2020)   Social Connection and Isolation Panel [NHANES]    Frequency of Communication with Friends and Family: More than three times a week    Frequency of Social Gatherings with Friends and Family:  Three times a week    Attends Religious Services: More than 4 times per year    Active Member of Clubs or Organizations: No    Attends Banker Meetings: Never    Marital Status: Separated  Intimate Partner Violence: Not At Risk (12/10/2020)   Humiliation, Afraid, Rape, and Kick questionnaire    Fear of Current or Ex-Partner: No    Emotionally Abused: No    Physically Abused: No    Sexually Abused: No    Outpatient Medications Prior to Visit  Medication Sig Dispense Refill   Aspirin-Acetaminophen-Caffeine (EXCEDRIN MIGRAINE PO) Take 2 tablets by mouth 3 (three) times daily as needed (migraines).      cyclobenzaprine (FLEXERIL) 5 MG tablet Take 1 tablet (5 mg total) by mouth 3 (three) times daily as needed for muscle spasms. 30 tablet 1   Multiple Vitamins-Minerals (EMERGEN-C IMMUNE PLUS/VIT D) CHEW Chew 2 tablets by mouth daily.     Multiple Vitamins-Minerals (WOMENS MULTI VITAMIN & MINERAL PO) Take 1 tablet by mouth daily.     citalopram (CELEXA) 10 MG tablet TAKE 1 TABLET BY MOUTH EVERY DAY 90 tablet 2   phentermine (ADIPEX-P) 37.5 MG tablet Take 0.5 tablets (18.75 mg total) by mouth daily before breakfast. 30 tablet 0   No facility-administered medications prior to visit.    Allergies  Allergen Reactions   Lexapro [Escitalopram] Other (See Comments)    6 mths Cried all  the time.    Prozac [Fluoxetine] Other (See Comments)    Suicidal thoughts   Zoloft [Sertraline Hcl] Other (See Comments)    Suicidal thoughts   Wellbutrin [Bupropion] Hives and Rash   Vicodin [Hydrocodone-Acetaminophen] Other (See Comments)    hallucinate    ROS Review of Systems  Constitutional:  Positive for fatigue. Negative for chills and fever.  HENT:  Negative for congestion, sinus pressure, sinus pain and sore throat.        Right-sided hearing difficulty  Eyes:  Negative for pain and discharge.  Respiratory:  Negative for cough and shortness of breath.   Cardiovascular:  Negative for  chest pain and palpitations.  Gastrointestinal:  Negative for abdominal pain, diarrhea, nausea and vomiting.  Endocrine: Negative for polydipsia and polyuria.  Genitourinary:  Negative for dysuria and hematuria.  Musculoskeletal:  Negative for neck pain and neck stiffness.  Skin:  Negative for rash.  Neurological:  Negative for dizziness and weakness.  Psychiatric/Behavioral:  Positive for sleep disturbance. Negative for agitation and behavioral problems. The patient is nervous/anxious.       Objective:    Physical Exam Vitals reviewed.  Constitutional:      General: She is not in acute distress.    Appearance: She is not diaphoretic.  HENT:  Head: Normocephalic and atraumatic.     Right Ear: There is impacted cerumen.     Nose: Nose normal.     Mouth/Throat:     Mouth: Mucous membranes are moist.  Eyes:     General: No scleral icterus.    Extraocular Movements: Extraocular movements intact.  Cardiovascular:     Rate and Rhythm: Normal rate and regular rhythm.     Pulses: Normal pulses.     Heart sounds: Normal heart sounds. No murmur heard. Pulmonary:     Breath sounds: Normal breath sounds. No wheezing or rales.  Abdominal:     Palpations: Abdomen is soft.     Tenderness: There is no abdominal tenderness.  Musculoskeletal:     Cervical back: Neck supple. No tenderness.     Right lower leg: No edema.     Left lower leg: No edema.  Skin:    General: Skin is warm.     Findings: No rash.  Neurological:     General: No focal deficit present.     Mental Status: She is alert and oriented to person, place, and time.     Sensory: No sensory deficit.     Motor: No weakness.  Psychiatric:        Mood and Affect: Mood is anxious.        Behavior: Behavior normal. Behavior is cooperative.        Thought Content: Thought content does not include homicidal or suicidal ideation.     BP 122/75 (BP Location: Right Arm, Patient Position: Sitting, Cuff Size: Normal)   Pulse  79   Ht 5\' 2"  (1.575 m)   Wt 166 lb (75.3 kg)   SpO2 98%   BMI 30.36 kg/m  Wt Readings from Last 3 Encounters:  03/09/23 166 lb (75.3 kg)  11/12/22 168 lb 6.4 oz (76.4 kg)  08/06/22 159 lb 0.6 oz (72.1 kg)    Lab Results  Component Value Date   TSH 2.430 03/05/2023   Lab Results  Component Value Date   WBC 5.3 03/05/2023   HGB 11.9 03/05/2023   HCT 35.9 03/05/2023   MCV 85 03/05/2023   PLT 328 03/05/2023   Lab Results  Component Value Date   NA 141 03/05/2023   K 5.5 (H) 03/05/2023   CO2 22 03/05/2023   GLUCOSE 96 03/05/2023   BUN 23 03/05/2023   CREATININE 0.95 03/05/2023   BILITOT <0.2 03/05/2023   ALKPHOS 79 03/05/2023   AST 31 03/05/2023   ALT 16 03/05/2023   PROT 7.0 03/05/2023   ALBUMIN 4.4 03/05/2023   CALCIUM 9.2 03/05/2023   EGFR 71 03/05/2023   Lab Results  Component Value Date   CHOL 160 03/05/2023   Lab Results  Component Value Date   HDL 79 03/05/2023   Lab Results  Component Value Date   LDLCALC 72 03/05/2023   Lab Results  Component Value Date   TRIG 41 03/05/2023   Lab Results  Component Value Date   CHOLHDL 2.0 03/05/2023   Lab Results  Component Value Date   HGBA1C 5.6 03/05/2023      Assessment & Plan:   Problem List Items Addressed This Visit       Nervous and Auditory   Hearing loss of right ear due to cerumen impaction    Ear irrigation done today, patient tolerated procedure well. Hearing improved after ear irrigation        Other   Depression    Flowsheet Row Office  Visit from 03/09/2023 in [facility]  PHQ-9 Total Score 5     Has tried multiple SSRIs and Wellbutrin in the past Uncontrolled with Celexa 10 mg QD now Increased dose of Celexa to 15 mg QD Followed by [facility] therapy      Relevant Medications   citalopram (CELEXA) 10 MG tablet   ALPRAZolam (XANAX) 0.25 MG tablet   Encounter for general adult medical examination with abnormal findings - Primary    Physical exam as  documented. Fasting blood tests reviewed and discussed with the patient in detail today.      Panic disorder    Recent episode of chest heaviness and dyspnea likely due to panic Xanax as needed for panic episode Increased dose of Celexa to 15 mg QD      Relevant Medications   citalopram (CELEXA) 10 MG tablet   ALPRAZolam (XANAX) 0.25 MG tablet    Meds ordered this encounter  Medications   citalopram (CELEXA) 10 MG tablet    Sig: Take 1.5 tablets (15 mg total) by mouth daily.    Dispense:  135 tablet    Refill:  1   ALPRAZolam (XANAX) 0.25 MG tablet    Sig: Take 1 tablet (0.25 mg total) by mouth 2 (two) times daily as needed for anxiety (Panic episode).    Dispense:  15 tablet    Refill:  0    Follow-up: Return in about 3 months (around 06/09/2023) for MDD and panic disorder.    Anabel Halon, MD

## 2023-03-09 NOTE — Assessment & Plan Note (Signed)
Ear irrigation done today, patient tolerated procedure well. Hearing improved after ear irrigation

## 2023-03-09 NOTE — Patient Instructions (Signed)
Please start taking Celexa 1.5 tablets once daily instead of 1 tablet.  Please take Xanax as needed for panic episode only.  Please continue to follow low carb diet and perform moderate exercise/walking at least 150 mins/week.

## 2023-03-09 NOTE — Assessment & Plan Note (Signed)
Recent episode of chest heaviness and dyspnea likely due to panic Xanax as needed for panic episode Increased dose of Celexa to 15 mg QD

## 2023-04-03 ENCOUNTER — Other Ambulatory Visit (HOSPITAL_COMMUNITY): Payer: Self-pay | Admitting: Adult Health

## 2023-04-03 DIAGNOSIS — Z1231 Encounter for screening mammogram for malignant neoplasm of breast: Secondary | ICD-10-CM

## 2023-05-15 ENCOUNTER — Ambulatory Visit (HOSPITAL_COMMUNITY)
Admission: RE | Admit: 2023-05-15 | Discharge: 2023-05-15 | Disposition: A | Discharge status: Home / Self Care | Payer: BC Managed Care – PPO | Source: Ambulatory Visit | Attending: Adult Health | Admitting: Adult Health

## 2023-05-15 ENCOUNTER — Encounter (HOSPITAL_COMMUNITY): Payer: Self-pay

## 2023-05-15 DIAGNOSIS — Z1231 Encounter for screening mammogram for malignant neoplasm of breast: Secondary | ICD-10-CM | POA: Diagnosis not present

## 2023-05-18 ENCOUNTER — Other Ambulatory Visit (HOSPITAL_COMMUNITY): Payer: Self-pay | Admitting: Adult Health

## 2023-05-18 DIAGNOSIS — R928 Other abnormal and inconclusive findings on diagnostic imaging of breast: Secondary | ICD-10-CM

## 2023-06-07 ENCOUNTER — Other Ambulatory Visit: Payer: Self-pay | Admitting: Internal Medicine

## 2023-06-07 DIAGNOSIS — F331 Major depressive disorder, recurrent, moderate: Secondary | ICD-10-CM

## 2023-06-10 ENCOUNTER — Encounter: Payer: Self-pay | Admitting: Internal Medicine

## 2023-06-10 ENCOUNTER — Ambulatory Visit: Payer: BC Managed Care – PPO | Admitting: Internal Medicine

## 2023-06-10 VITALS — BP 118/66 | HR 86 | Ht 62.0 in | Wt 170.8 lb

## 2023-06-10 DIAGNOSIS — E669 Obesity, unspecified: Secondary | ICD-10-CM | POA: Diagnosis not present

## 2023-06-10 DIAGNOSIS — Z23 Encounter for immunization: Secondary | ICD-10-CM

## 2023-06-10 DIAGNOSIS — F410 Panic disorder [episodic paroxysmal anxiety] without agoraphobia: Secondary | ICD-10-CM | POA: Diagnosis not present

## 2023-06-10 DIAGNOSIS — F331 Major depressive disorder, recurrent, moderate: Secondary | ICD-10-CM

## 2023-06-10 MED ORDER — CITALOPRAM HYDROBROMIDE 20 MG PO TABS: 20.0000 mg | ORAL_TABLET | Freq: Every day | ORAL | 1 refills | Status: DC

## 2023-06-10 NOTE — Progress Notes (Signed)
Established Patient Office Visit  Subjective:  Patient ID: Sherri Salazar, female    DOB: Dec 29, 1965  Age: 57 y.o. MRN: [id number]  CC:  Chief Complaint  Patient presents with   panic disorder    Follow up   MDD    Follow up   Dizziness    Over labor day weekend noticed a spell where she gets dizzy when looking up and down.     HPI Sherri Salazar is a 57 y.o. female with past medical history of migraine, MDD and anemia who presents for f/u of her chronic medical conditions.  MDD: She has been feeling overwhelmed and has had panic episodes at work. She has been stressed as she lives alone and has a rented place.  She was doing well with Celexa 10 mg initially, her dose was increased to 15 mg in the last visit, which has improved her symptoms somewhat.  She denies crying spells currently. She works at school currently. She denies any SI or HI currently.  She has tried multiple SSRIs in the past.    Obesity: She has been trying to low low-carb diet.  She has tried intermittent fasting, but could not continue it.  She has been following weight watchers and has joined a gym.  She walks about 3 to 4 miles per day. She has tried Phentermine in the past.  Past Medical History:  Diagnosis Date   Migraines    PONV (postoperative nausea and vomiting)     Past Surgical History:  Procedure Laterality Date   ABLATION     BIOPSY  09/17/2021   Procedure: BIOPSY;  Surgeon: Lanelle Bal, DO;  Location: [facility];  Service: Endoscopy;;   CESAREAN SECTION  2000,2002   COLONOSCOPY WITH PROPOFOL N/A 09/17/2021   Procedure: COLONOSCOPY WITH PROPOFOL;  Surgeon: Lanelle Bal, DO;  Location: [facility];  Service: Endoscopy;  Laterality: N/A;  8:00am   VENTRAL HERNIA REPAIR N/A 03/09/2020   Procedure: VENTRAL HERNIA REPAIR;  Surgeon: Lucretia Roers, MD;  Location: [facility];  Service: General;  Laterality: N/A;    Family History  Problem Relation Age of Onset   Parkinson's disease  Father    Colon cancer Neg Hx     Social History   Socioeconomic History   Marital status: Divorced    Spouse name: Not on file   Number of children: Not on file   Years of education: Not on file   Highest education level: Not on file  Occupational History   Not on file  Tobacco Use   Smoking status: Former    Current packs/day: 0.00    Types: Cigarettes    Quit date: 09/29/1985    Years since quitting: 37.7   Smokeless tobacco: Never   Tobacco comments:    tried but not consistent  Vaping Use   Vaping status: Never Used  Substance and Sexual Activity   Alcohol use: No   Drug use: No   Sexual activity: Not Currently    Birth control/protection: Surgical  Other Topics Concern   Not on file  Social History Narrative   Divorced since April 2022,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   Social Determinants of Health   Financial Resource Strain: Low Risk  (12/10/2020)   Overall Financial Resource Strain (CARDIA)    Difficulty of Paying Living Expenses: Not very hard  Food Insecurity: Food Insecurity Present (12/10/2020)   Hunger Vital Sign    Worried About Running  Out of Food in the Last Year: Sometimes true    Ran Out of Food in the Last Year: Never true  Transportation Needs: No Transportation Needs (12/10/2020)   PRAPARE - Administrator, Civil Service (Medical): No    Lack of Transportation (Non-Medical): No  Physical Activity: Sufficiently Active (12/10/2020)   Exercise Vital Sign    Days of Exercise per Week: 5 days    Minutes of Exercise per Session: 60 min  Stress: Stress Concern Present (04/25/2022)   [employer] of Occupational Health - Occupational Stress Questionnaire    Feeling of Stress : To some extent  Social Connections: Moderately Isolated (12/10/2020)   Social Connection and Isolation Panel [NHANES]    Frequency of Communication with Friends and Family: More than three times a week    Frequency of Social  Gatherings with Friends and Family: Three times a week    Attends Religious Services: More than 4 times per year    Active Member of Clubs or Organizations: No    Attends Banker Meetings: Never    Marital Status: Separated  Intimate Partner Violence: Not At Risk (12/10/2020)   Humiliation, Afraid, Rape, and Kick questionnaire    Fear of Current or Ex-Partner: No    Emotionally Abused: No    Physically Abused: No    Sexually Abused: No    Outpatient Medications Prior to Visit  Medication Sig Dispense Refill   ALPRAZolam (XANAX) 0.25 MG tablet Take 1 tablet (0.25 mg total) by mouth 2 (two) times daily as needed for anxiety (Panic episode). 15 tablet 0   Aspirin-Acetaminophen-Caffeine (EXCEDRIN MIGRAINE PO) Take 2 tablets by mouth 3 (three) times daily as needed (migraines).      cyclobenzaprine (FLEXERIL) 5 MG tablet Take 1 tablet (5 mg total) by mouth 3 (three) times daily as needed for muscle spasms. 30 tablet 1   Multiple Vitamins-Minerals (EMERGEN-C IMMUNE PLUS/VIT D) CHEW Chew 2 tablets by mouth daily.     Multiple Vitamins-Minerals (WOMENS MULTI VITAMIN & MINERAL PO) Take 1 tablet by mouth daily.     citalopram (CELEXA) 10 MG tablet TAKE 1 TABLET BY MOUTH EVERY DAY 90 tablet 2   No facility-administered medications prior to visit.    Allergies  Allergen Reactions   Lexapro [Escitalopram] Other (See Comments)    6 mths Cried all  the time.    Prozac [Fluoxetine] Other (See Comments)    Suicidal thoughts   Zoloft [Sertraline Hcl] Other (See Comments)    Suicidal thoughts   Wellbutrin [Bupropion] Hives and Rash   Vicodin [Hydrocodone-Acetaminophen] Other (See Comments)    hallucinate    ROS Review of Systems  Constitutional:  Positive for fatigue. Negative for chills and fever.  HENT:  Negative for congestion, sinus pressure, sinus pain and sore throat.   Eyes:  Negative for pain and discharge.  Respiratory:  Negative for cough and shortness of breath.    Cardiovascular:  Negative for chest pain and palpitations.  Gastrointestinal:  Negative for abdominal pain, diarrhea, nausea and vomiting.  Endocrine: Negative for polydipsia and polyuria.  Genitourinary:  Negative for dysuria and hematuria.  Musculoskeletal:  Negative for neck pain and neck stiffness.  Skin:  Negative for rash.  Neurological:  Negative for dizziness and weakness.  Psychiatric/Behavioral:  Positive for sleep disturbance. Negative for agitation and behavioral problems. The patient is nervous/anxious.       Objective:    Physical Exam Vitals reviewed.  Constitutional:  General: She is not in acute distress.    Appearance: She is not diaphoretic.  HENT:     Head: Normocephalic and atraumatic.     Nose: Nose normal.     Mouth/Throat:     Mouth: Mucous membranes are moist.  Eyes:     General: No scleral icterus.    Extraocular Movements: Extraocular movements intact.  Cardiovascular:     Rate and Rhythm: Normal rate and regular rhythm.     Pulses: Normal pulses.     Heart sounds: Normal heart sounds. No murmur heard. Pulmonary:     Breath sounds: Normal breath sounds. No wheezing or rales.  Musculoskeletal:     Cervical back: Neck supple. No tenderness.     Right lower leg: No edema.     Left lower leg: No edema.  Skin:    General: Skin is warm.     Findings: No rash.  Neurological:     General: No focal deficit present.     Mental Status: She is alert and oriented to person, place, and time.     Sensory: No sensory deficit.     Motor: No weakness.  Psychiatric:        Mood and Affect: Mood normal.        Behavior: Behavior normal. Behavior is cooperative.        Thought Content: Thought content normal. Thought content does not include homicidal or suicidal ideation.     BP 118/66   Pulse 86   Ht 5\' 2"  (1.575 m)   Wt 170 lb 12.8 oz (77.5 kg)   SpO2 95%   BMI 31.24 kg/m  Wt Readings from Last 3 Encounters:  06/10/23 170 lb 12.8 oz (77.5 kg)   03/09/23 166 lb (75.3 kg)  11/12/22 168 lb 6.4 oz (76.4 kg)    Lab Results  Component Value Date   TSH 2.430 03/05/2023   Lab Results  Component Value Date   WBC 5.3 03/05/2023   HGB 11.9 03/05/2023   HCT 35.9 03/05/2023   MCV 85 03/05/2023   PLT 328 03/05/2023   Lab Results  Component Value Date   NA 141 03/05/2023   K 5.5 (H) 03/05/2023   CO2 22 03/05/2023   GLUCOSE 96 03/05/2023   BUN 23 03/05/2023   CREATININE 0.95 03/05/2023   BILITOT <0.2 03/05/2023   ALKPHOS 79 03/05/2023   AST 31 03/05/2023   ALT 16 03/05/2023   PROT 7.0 03/05/2023   ALBUMIN 4.4 03/05/2023   CALCIUM 9.2 03/05/2023   EGFR 71 03/05/2023   Lab Results  Component Value Date   CHOL 160 03/05/2023   Lab Results  Component Value Date   HDL 79 03/05/2023   Lab Results  Component Value Date   LDLCALC 72 03/05/2023   Lab Results  Component Value Date   TRIG 41 03/05/2023   Lab Results  Component Value Date   CHOLHDL 2.0 03/05/2023   Lab Results  Component Value Date   HGBA1C 5.6 03/05/2023      Assessment & Plan:   Problem List Items Addressed This Visit       Other   Depression    Flowsheet Row Office Visit from 06/10/2023 in [facility]  PHQ-9 Total Score 7      Has tried multiple SSRIs and Wellbutrin in the past Uncontrolled with Celexa 15 mg QD now Increased dose of Celexa to 20 mg QD Followed by [facility] therapy  Relevant Medications   citalopram (CELEXA) 20 MG tablet   Obesity (BMI 30-39.9)    BMI Readings from Last 1 Encounters:  06/10/23 31.24 kg/m   Had extensive discussion about diet and medical weight loss options in the past Has tried Phentermine with adequate response Continue to follow low-carb diet and perform moderate exercise/walking at least 150 minutes/week Advised to follow weight loss programs such as weight watchers      Panic disorder - Primary    Previous episode of chest heaviness and dyspnea likely due to  panic Xanax as needed for panic episode Increased dose of Celexa to 20 mg QD      Relevant Medications   citalopram (CELEXA) 20 MG tablet   Other Visit Diagnoses     Encounter for immunization       Relevant Orders   Flu vaccine trivalent PF, 6mos and older(Flulaval,Afluria,Fluarix,Fluzone) (Completed)        Meds ordered this encounter  Medications   citalopram (CELEXA) 20 MG tablet    Sig: Take 1 tablet (20 mg total) by mouth daily.    Dispense:  90 tablet    Refill:  1    Dose change    Follow-up: Return in about 4 months (around 10/10/2023) for Anxiety.    Anabel Halon, MD

## 2023-06-10 NOTE — Patient Instructions (Signed)
Please start taking Citalopram 20 mg once daily.  Please try stress reduction exercises such as Yoga for 15-20 minutes in a day.

## 2023-06-10 NOTE — Assessment & Plan Note (Signed)
Flowsheet Row Office Visit from 06/10/2023 in [facility]  PHQ-9 Total Score 7      Has tried multiple SSRIs and Wellbutrin in the past Uncontrolled with Celexa 15 mg QD now Increased dose of Celexa to 20 mg QD Followed by [facility] therapy

## 2023-06-10 NOTE — Assessment & Plan Note (Signed)
BMI Readings from Last 1 Encounters:  06/10/23 31.24 kg/m   Had extensive discussion about diet and medical weight loss options in the past Has tried Phentermine with adequate response Continue to follow low-carb diet and perform moderate exercise/walking at least 150 minutes/week Advised to follow weight loss programs such as weight watchers

## 2023-06-10 NOTE — Assessment & Plan Note (Addendum)
Previous episode of chest heaviness and dyspnea likely due to panic Xanax as needed for panic episode Increased dose of Celexa to 20 mg QD

## 2023-06-15 ENCOUNTER — Encounter: Payer: Self-pay | Admitting: Obstetrics & Gynecology

## 2023-06-16 ENCOUNTER — Encounter (HOSPITAL_COMMUNITY): Payer: Self-pay

## 2023-06-16 ENCOUNTER — Ambulatory Visit (HOSPITAL_COMMUNITY)
Admission: RE | Admit: 2023-06-16 | Discharge: 2023-06-16 | Disposition: A | Discharge status: Home / Self Care | Payer: BC Managed Care – PPO | Source: Ambulatory Visit | Attending: Adult Health | Admitting: Adult Health

## 2023-06-16 DIAGNOSIS — R928 Other abnormal and inconclusive findings on diagnostic imaging of breast: Secondary | ICD-10-CM

## 2023-07-31 ENCOUNTER — Telehealth: Payer: BC Managed Care – PPO | Admitting: Physician Assistant

## 2023-07-31 DIAGNOSIS — J208 Acute bronchitis due to other specified organisms: Secondary | ICD-10-CM

## 2023-07-31 DIAGNOSIS — B9689 Other specified bacterial agents as the cause of diseases classified elsewhere: Secondary | ICD-10-CM | POA: Diagnosis not present

## 2023-07-31 MED ORDER — BENZONATATE 100 MG PO CAPS
100.0000 mg | ORAL_CAPSULE | Freq: Three times a day (TID) | ORAL | 0 refills | Status: DC | PRN
Start: 2023-07-31 — End: 2024-04-12

## 2023-07-31 MED ORDER — AZITHROMYCIN 250 MG PO TABS
ORAL_TABLET | ORAL | 0 refills | Status: AC
Start: 2023-07-31 — End: 2023-08-05

## 2023-07-31 NOTE — Progress Notes (Signed)
E-Visit for Cough   We are sorry that you are not feeling well.  Here is how we plan to help!  Based on your presentation I believe you most likely have A cough due to bacteria.  When patients have a fever and a productive cough with a change in color or increased sputum production, we are concerned about bacterial bronchitis.  If left untreated it can progress to pneumonia.  If your symptoms do not improve with your treatment plan it is important that you contact your provider.   I have prescribed Azithromyin 250 mg: two tablets now and then one tablet daily for 4 additonal days    In addition you may use A non-prescription cough medication called Mucinex DM: take 2 tablets every 12 hours. and A prescription cough medication called Tessalon Perles 100mg . You may take 1-2 capsules every 8 hours as needed for your cough. Tessalon perles can be used together with other over the counter cough medications if needed.   From your responses in the eVisit questionnaire you describe inflammation in the upper respiratory tract which is causing a significant cough.  This is commonly called Bronchitis and has four common causes:   Allergies Viral Infections Acid Reflux Bacterial Infection Allergies, viruses and acid reflux are treated by controlling symptoms or eliminating the cause. An example might be a cough caused by taking certain blood pressure medications. You stop the cough by changing the medication. Another example might be a cough caused by acid reflux. Controlling the reflux helps control the cough.  USE OF BRONCHODILATOR ("RESCUE") INHALERS: There is a risk from using your bronchodilator too frequently.  The risk is that over-reliance on a medication which only relaxes the muscles surrounding the breathing tubes can reduce the effectiveness of medications prescribed to reduce swelling and congestion of the tubes themselves.  Although you feel brief relief from the bronchodilator inhaler, your  asthma may actually be worsening with the tubes becoming more swollen and filled with mucus.  This can delay other crucial treatments, such as oral steroid medications. If you need to use a bronchodilator inhaler daily, several times per day, you should discuss this with your provider.  There are probably better treatments that could be used to keep your asthma under control.     HOME CARE Only take medications as instructed by your medical team. Complete the entire course of an antibiotic. Drink plenty of fluids and get plenty of rest. Avoid close contacts especially the very young and the elderly Cover your mouth if you cough or cough into your sleeve. Always remember to wash your hands A steam or ultrasonic humidifier can help congestion.   GET HELP RIGHT AWAY IF: You develop worsening fever. You become short of breath You cough up blood. Your symptoms persist after you have completed your treatment plan MAKE SURE YOU  Understand these instructions. Will watch your condition. Will get help right away if you are not doing well or get worse.    Thank you for choosing an e-visit.  Your e-visit answers were reviewed by a board certified advanced clinical practitioner to complete your personal care plan. Depending upon the condition, your plan could have included both over the counter or prescription medications.  Please review your pharmacy choice. Make sure the pharmacy is open so you can pick up prescription now. If there is a problem, you may contact your provider through [employer] and have the prescription routed to another pharmacy.  Your safety is important to  [place]. If you have drug allergies check your prescription carefully.   For the next 24 hours you can use MyChart to ask questions about today's visit, request a non-urgent call back, or ask for a work or school excuse. You will get an email in the next two days asking about your experience. I hope that your e-visit has  been valuable and will speed your recovery.  I have spent 5 minutes in review of e-visit questionnaire, review and updating patient chart, medical decision making and response to patient.   Margaretann Loveless, PA-C

## 2023-10-19 ENCOUNTER — Ambulatory Visit: Payer: BC Managed Care – PPO | Admitting: Internal Medicine

## 2023-12-10 ENCOUNTER — Telehealth: Payer: Self-pay | Admitting: *Deleted

## 2023-12-10 NOTE — Telephone Encounter (Signed)
 Received call from patient [phone]~ telephone.   Patient S/P epigastric hernia repair in 2021. Reports that she is having bulging and tenderness at repair site x4 weeks. Reports that bulging is reducible.   Appointment scheduled for evaluation.

## 2023-12-22 ENCOUNTER — Ambulatory Visit: Admitting: General Surgery

## 2024-01-10 ENCOUNTER — Other Ambulatory Visit: Payer: Self-pay | Admitting: Internal Medicine

## 2024-01-10 DIAGNOSIS — F331 Major depressive disorder, recurrent, moderate: Secondary | ICD-10-CM

## 2024-04-08 NOTE — Progress Notes (Signed)
 Sent message, via epic in basket, requesting orders in epic from Careers adviser.

## 2024-04-12 ENCOUNTER — Ambulatory Visit: Payer: Self-pay | Admitting: Surgery

## 2024-04-14 NOTE — Progress Notes (Signed)
 COVID Vaccine Completed: yes  Date of COVID positive in last 90 days:  PCP - Suzzane Blanch, MD Cardiologist -   Chest x-ray -  EKG -  Stress Test -  ECHO -  Cardiac Cath -  Pacemaker/ICD device last checked: Spinal Cord Stimulator:  Bowel Prep -   Sleep Study -  CPAP -   Fasting Blood Sugar - preDM Checks Blood Sugar _____ times a day  Last dose of GLP1 agonist-  N/A GLP1 instructions:  Do not take after     Last dose of SGLT-2 inhibitors-  N/A SGLT-2 instructions:  Do not take after     Blood Thinner Instructions:  Last dose:   Time: Aspirin Instructions: Last Dose:  Activity level:  Can go up a flight of stairs and perform activities of daily living without stopping and without symptoms of chest pain or shortness of breath.  Able to exercise without symptoms  Unable to go up a flight of stairs without symptoms of     Anesthesia review:   Patient denies shortness of breath, fever, cough and chest pain at PAT appointment  Patient verbalized understanding of instructions that were given to them at the PAT appointment. Patient was also instructed that they will need to review over the PAT instructions again at home before surgery.

## 2024-04-14 NOTE — Patient Instructions (Signed)
 SURGICAL WAITING ROOM VISITATION  Patients having surgery or a procedure may have no more than 2 support people in the waiting area - these visitors may rotate.    Children under the age of 26 must have an adult with them who is not the patient.  Visitors with respiratory illnesses are discouraged from visiting and should remain at home.  If the patient needs to stay at the hospital during part of their recovery, the visitor guidelines for inpatient rooms apply. Pre-op nurse will coordinate an appropriate time for 1 support person to accompany patient in pre-op.  This support person may not rotate.    Please refer to the [facility] website for the visitor guidelines for Inpatients (after your surgery is over and you are in a regular room).    Your procedure is scheduled on: 04/27/24   Report to [facility] Main Entrance    Report to admitting at 7:15 AM   Call this number if you have problems the morning of surgery [phone]   Do not eat food :After Midnight.   After Midnight you may have the following liquids until 6:30 AM DAY OF SURGERY  Water Non-Citrus Juices (without pulp, NO RED-Apple, White grape, White cranberry) Black Coffee (NO MILK/CREAM OR CREAMERS, sugar ok)  Clear Tea (NO MILK/CREAM OR CREAMERS, sugar ok) regular and decaf                             Plain Jell-O (NO RED)                                           Fruit ices (not with fruit pulp, NO RED)                                     Popsicles (NO RED)                                                               Sports drinks like Gatorade (NO RED)                      If you have questions, please contact your surgeon's office.   FOLLOW BOWEL PREP AND ANY ADDITIONAL PRE OP INSTRUCTIONS YOU RECEIVED FROM YOUR SURGEON'S OFFICE!!!     Oral Hygiene is also important to reduce your risk of infection.                                    Remember - BRUSH YOUR TEETH THE MORNING OF SURGERY WITH YOUR  REGULAR TOOTHPASTE  DENTURES WILL BE REMOVED PRIOR TO SURGERY PLEASE DO NOT APPLY Poly grip OR ADHESIVES!!!   Stop all vitamins and herbal supplements 7 days before surgery.   Take these medicines the morning of surgery with A SIP OF WATER: Citalopram   You may not have any metal on your body including hair pins, jewelry, and body piercing             Do not wear make-up, lotions, powders, perfumes, or deodorant  Do not wear nail polish including gel and S&S, artificial/acrylic nails, or any other type of covering on natural nails including finger and toenails. If you have artificial nails, gel coating, etc. that needs to be removed by a nail salon please have this removed prior to surgery or surgery may need to be canceled/ delayed if the surgeon/ anesthesia feels like they are unable to be safely monitored.   Do not shave  48 hours prior to surgery.    Do not bring valuables to the hospital. [facility] IS NOT             RESPONSIBLE   FOR VALUABLES.   Contacts, glasses, dentures or bridgework may not be worn into surgery.  DO NOT BRING YOUR HOME MEDICATIONS TO THE HOSPITAL. PHARMACY WILL DISPENSE MEDICATIONS LISTED ON YOUR MEDICATION LIST TO YOU DURING YOUR ADMISSION IN THE HOSPITAL!    Patients discharged on the day of surgery will not be allowed to drive home.  Someone NEEDS to stay with you for the first 24 hours after anesthesia.              Please read over the following fact sheets you were given: IF YOU HAVE QUESTIONS ABOUT YOUR PRE-OP INSTRUCTIONS PLEASE CALL [phone] [place]    If you received a COVID test during your pre-op visit  it is requested that you wear a mask when out in public, stay away from anyone that may not be feeling well and notify your surgeon if you develop symptoms. If you test positive for Covid or have been in contact with anyone that has tested positive in the last 10 days please notify you surgeon.    [place] -  Preparing for Surgery Before surgery, you can play an important role.  Because skin is not sterile, your skin needs to be as free of germs as possible.  You can reduce the number of germs on your skin by washing with CHG (chlorahexidine gluconate) soap before surgery.  CHG is an antiseptic cleaner which kills germs and bonds with the skin to continue killing germs even after washing. Please DO NOT use if you have an allergy to CHG or antibacterial soaps.  If your skin becomes reddened/irritated stop using the CHG and inform your nurse when you arrive at Short Stay. Do not shave (including legs and underarms) for at least 48 hours prior to the first CHG shower.  You may shave your face/neck.  Please follow these instructions carefully:  1.  Shower with CHG Soap the night before surgery and the  morning of surgery.  2.  If you choose to wash your hair, wash your hair first as usual with your normal  shampoo.  3.  After you shampoo, rinse your hair and body thoroughly to remove the shampoo.                             4.  Use CHG as you would any other liquid soap.  You can apply chg directly to the skin and wash.  Gently with a scrungie or clean washcloth.  5.  Apply the CHG Soap to your body ONLY FROM THE NECK DOWN.   Do   not use on  face/ open                           Wound or open sores. Avoid contact with eyes, ears mouth and   genitals (private parts).                       Wash face,  Genitals (private parts) with your normal soap.             6.  Wash thoroughly, paying special attention to the area where your    surgery  will be performed.  7.  Thoroughly rinse your body with warm water from the neck down.  8.  DO NOT shower/wash with your normal soap after using and rinsing off the CHG Soap.                9.  Pat yourself dry with a clean towel.            10.  Wear clean pajamas.            11.  Place clean sheets on your bed the night of your first shower and do not  sleep with  pets. Day of Surgery : Do not apply any lotions/deodorants the morning of surgery.  Please wear clean clothes to the hospital/surgery center.  FAILURE TO FOLLOW THESE INSTRUCTIONS MAY RESULT IN THE CANCELLATION OF YOUR SURGERY  PATIENT SIGNATURE_________________________________  NURSE SIGNATURE__________________________________  ________________________________________________________________________

## 2024-04-15 ENCOUNTER — Encounter (HOSPITAL_COMMUNITY): Payer: Self-pay

## 2024-04-15 ENCOUNTER — Other Ambulatory Visit: Payer: Self-pay

## 2024-04-15 ENCOUNTER — Encounter (HOSPITAL_COMMUNITY)
Admission: RE | Admit: 2024-04-15 | Discharge: 2024-04-15 | Disposition: A | Discharge status: Home / Self Care | Payer: Self-pay | Source: Ambulatory Visit | Attending: Surgery | Admitting: Surgery

## 2024-04-15 VITALS — BP 115/82 | HR 71 | Temp 97.6°F | Resp 16 | Ht 62.0 in | Wt 128.0 lb

## 2024-04-15 DIAGNOSIS — D649 Anemia, unspecified: Secondary | ICD-10-CM | POA: Diagnosis not present

## 2024-04-15 DIAGNOSIS — Z01812 Encounter for preprocedural laboratory examination: Secondary | ICD-10-CM | POA: Diagnosis present

## 2024-04-15 HISTORY — DX: Anxiety disorder, unspecified: F41.9

## 2024-04-15 LAB — CBC
HCT: 37.8 % (ref 36.0–46.0)
Hemoglobin: 12.5 g/dL (ref 12.0–15.0)
MCH: 29.3 pg (ref 26.0–34.0)
MCHC: 33.1 g/dL (ref 30.0–36.0)
MCV: 88.7 fL (ref 80.0–100.0)
Platelets: 326 K/uL (ref 150–400)
RBC: 4.26 MIL/uL (ref 3.87–5.11)
RDW: 12.4 % (ref 11.5–15.5)
WBC: 8.9 K/uL (ref 4.0–10.5)
nRBC: 0 % (ref 0.0–0.2)

## 2024-04-26 NOTE — Anesthesia Preprocedure Evaluation (Addendum)
 Anesthesia Evaluation  Patient identified by MRN, date of birth, ID band Patient awake    Reviewed: Allergy & Precautions, NPO status , Patient's Chart, lab work & pertinent test results  History of Anesthesia Complications (+) PONV and history of anesthetic complications  Airway Mallampati: II  TM Distance: >3 FB Neck ROM: Full    Dental no notable dental hx.    Pulmonary former smoker   Pulmonary exam normal        Cardiovascular negative cardio ROS Normal cardiovascular exam     Neuro/Psych  Headaches  Anxiety Depression       GI/Hepatic Neg liver ROS,,,RECURRENT EPIGASTRIC HERNIA   Endo/Other  negative endocrine ROS    Renal/GU negative Renal ROS  negative genitourinary   Musculoskeletal negative musculoskeletal ROS (+)    Abdominal   Peds  Hematology negative hematology ROS (+)   Anesthesia Other Findings Day of surgery medications reviewed with patient.  Reproductive/Obstetrics negative OB ROS                              Anesthesia Physical Anesthesia Plan  ASA: 2  Anesthesia Plan: General   Post-op Pain Management: Ofirmev  IV (intra-op)* and Toradol  IV (intra-op)*   Induction: Intravenous  PONV Risk Score and Plan: 4 or greater and Treatment may vary due to age or medical condition, Ondansetron , Dexamethasone , Midazolam , Propofol  infusion, TIVA and Scopolamine  patch - Pre-op  Airway Management Planned: Oral ETT  Additional Equipment: None  Intra-op Plan:   Post-operative Plan: Extubation in OR  Informed Consent: I have reviewed the patients History and Physical, chart, labs and discussed the procedure including the risks, benefits and alternatives for the proposed anesthesia with the patient or authorized representative who has indicated his/her understanding and acceptance.     Dental advisory given  Plan Discussed with: CRNA  Anesthesia Plan Comments:           Anesthesia Quick Evaluation

## 2024-04-27 ENCOUNTER — Ambulatory Visit (HOSPITAL_COMMUNITY)
Admission: RE | Admit: 2024-04-27 | Discharge: 2024-04-27 | Disposition: A | Discharge status: Home / Self Care | Payer: Self-pay | Source: Ambulatory Visit | Attending: Surgery | Admitting: Surgery

## 2024-04-27 ENCOUNTER — Encounter (HOSPITAL_COMMUNITY)
Admission: RE | Disposition: A | Discharge status: Home / Self Care | Payer: Self-pay | Source: Ambulatory Visit | Attending: Surgery

## 2024-04-27 ENCOUNTER — Other Ambulatory Visit: Payer: Self-pay

## 2024-04-27 ENCOUNTER — Ambulatory Visit (HOSPITAL_BASED_OUTPATIENT_CLINIC_OR_DEPARTMENT_OTHER): Payer: Self-pay | Admitting: Anesthesiology

## 2024-04-27 ENCOUNTER — Encounter (HOSPITAL_COMMUNITY): Payer: Self-pay | Admitting: Surgery

## 2024-04-27 ENCOUNTER — Ambulatory Visit (HOSPITAL_COMMUNITY): Payer: Self-pay | Admitting: Medical

## 2024-04-27 DIAGNOSIS — K432 Incisional hernia without obstruction or gangrene: Secondary | ICD-10-CM

## 2024-04-27 DIAGNOSIS — F418 Other specified anxiety disorders: Secondary | ICD-10-CM | POA: Insufficient documentation

## 2024-04-27 DIAGNOSIS — Z87891 Personal history of nicotine dependence: Secondary | ICD-10-CM | POA: Insufficient documentation

## 2024-04-27 HISTORY — PX: EPIGASTRIC HERNIA REPAIR: SHX404

## 2024-04-27 SURGERY — REPAIR, HERNIA, EPIGASTRIC, ADULT
Anesthesia: General | Site: Abdomen

## 2024-04-27 MED ORDER — PROPOFOL 500 MG/50ML IV EMUL
INTRAVENOUS | Status: DC | PRN
Start: 2024-04-27 — End: 2024-04-27
  Administered 2024-04-27: 150 ug/kg/min via INTRAVENOUS

## 2024-04-27 MED ORDER — ACETAMINOPHEN 10 MG/ML IV SOLN
INTRAVENOUS | Status: DC | PRN
  Administered 2024-04-27: 1000 mg via INTRAVENOUS

## 2024-04-27 MED ORDER — DEXAMETHASONE SODIUM PHOSPHATE 10 MG/ML IJ SOLN
INTRAMUSCULAR | Status: AC
  Filled 2024-04-27: qty 1

## 2024-04-27 MED ORDER — CHLORHEXIDINE GLUCONATE CLOTH 2 % EX PADS: 6.0000 | MEDICATED_PAD | Freq: Once | CUTANEOUS | Status: DC

## 2024-04-27 MED ORDER — PROPOFOL 10 MG/ML IV BOLUS
INTRAVENOUS | Status: AC
Start: 2024-04-27 — End: 2024-04-27
  Filled 2024-04-27: qty 20

## 2024-04-27 MED ORDER — ORAL CARE MOUTH RINSE: 15.0000 mL | Freq: Once | OROMUCOSAL | Status: AC

## 2024-04-27 MED ORDER — DEXAMETHASONE SODIUM PHOSPHATE 10 MG/ML IJ SOLN
INTRAMUSCULAR | Status: DC | PRN
  Administered 2024-04-27: 5 mg via INTRAVENOUS

## 2024-04-27 MED ORDER — FENTANYL CITRATE (PF) 250 MCG/5ML IJ SOLN
INTRAMUSCULAR | Status: AC
  Filled 2024-04-27: qty 5

## 2024-04-27 MED ORDER — DEXMEDETOMIDINE HCL IN NACL 80 MCG/20ML IV SOLN
INTRAVENOUS | Status: AC
  Filled 2024-04-27: qty 20

## 2024-04-27 MED ORDER — SUGAMMADEX SODIUM 200 MG/2ML IV SOLN
INTRAVENOUS | Status: AC
Start: 2024-04-27 — End: 2024-04-27
  Filled 2024-04-27: qty 2

## 2024-04-27 MED ORDER — FENTANYL CITRATE (PF) 250 MCG/5ML IJ SOLN
INTRAMUSCULAR | Status: DC | PRN
  Administered 2024-04-27: 50 ug via INTRAVENOUS
  Administered 2024-04-27: 25 ug via INTRAVENOUS
  Administered 2024-04-27: 50 ug via INTRAVENOUS
  Administered 2024-04-27: 25 ug via INTRAVENOUS

## 2024-04-27 MED ORDER — BUPIVACAINE-EPINEPHRINE (PF) 0.25% -1:200000 IJ SOLN
INTRAMUSCULAR | Status: AC
  Filled 2024-04-27: qty 30

## 2024-04-27 MED ORDER — DROPERIDOL 2.5 MG/ML IJ SOLN: 0.6250 mg | Freq: Once | INTRAMUSCULAR | Status: DC | PRN

## 2024-04-27 MED ORDER — ROCURONIUM BROMIDE 100 MG/10ML IV SOLN
INTRAVENOUS | Status: DC | PRN
  Administered 2024-04-27: 10 mg via INTRAVENOUS
  Administered 2024-04-27: 40 mg via INTRAVENOUS

## 2024-04-27 MED ORDER — STERILE WATER FOR IRRIGATION IR SOLN
Status: DC | PRN
  Administered 2024-04-27: 1000 mL

## 2024-04-27 MED ORDER — KETOROLAC TROMETHAMINE 30 MG/ML IJ SOLN
INTRAMUSCULAR | Status: DC | PRN
  Administered 2024-04-27: 15 mg via INTRAVENOUS

## 2024-04-27 MED ORDER — MIDAZOLAM HCL 5 MG/5ML IJ SOLN
INTRAMUSCULAR | Status: DC | PRN
  Administered 2024-04-27: 2 mg via INTRAVENOUS

## 2024-04-27 MED ORDER — ACETAMINOPHEN 10 MG/ML IV SOLN
INTRAVENOUS | Status: AC
  Filled 2024-04-27: qty 100

## 2024-04-27 MED ORDER — ROCURONIUM BROMIDE 10 MG/ML (PF) SYRINGE
PREFILLED_SYRINGE | INTRAVENOUS | Status: AC
  Filled 2024-04-27: qty 10

## 2024-04-27 MED ORDER — PROPOFOL 10 MG/ML IV BOLUS
INTRAVENOUS | Status: DC | PRN
Start: 2024-04-27 — End: 2024-04-27
  Administered 2024-04-27: 50 mg via INTRAVENOUS
  Administered 2024-04-27: 100 mg via INTRAVENOUS
  Administered 2024-04-27: 50 mg via INTRAVENOUS

## 2024-04-27 MED ORDER — LACTATED RINGERS IV SOLN: INTRAVENOUS | Status: DC

## 2024-04-27 MED ORDER — LIDOCAINE HCL (PF) 2 % IJ SOLN
INTRAMUSCULAR | Status: AC
  Filled 2024-04-27: qty 5

## 2024-04-27 MED ORDER — OXYCODONE-ACETAMINOPHEN 5-325 MG PO TABS: 1.0000 | ORAL_TABLET | ORAL | 0 refills | Status: AC | PRN

## 2024-04-27 MED ORDER — HYDROMORPHONE HCL 1 MG/ML IJ SOLN: 0.2500 mg | INTRAMUSCULAR | Status: DC | PRN

## 2024-04-27 MED ORDER — ONDANSETRON HCL 4 MG/2ML IJ SOLN
INTRAMUSCULAR | Status: DC | PRN
  Administered 2024-04-27: 4 mg via INTRAVENOUS

## 2024-04-27 MED ORDER — ONDANSETRON HCL 4 MG/2ML IJ SOLN
INTRAMUSCULAR | Status: AC
  Filled 2024-04-27: qty 2

## 2024-04-27 MED ORDER — ACETAMINOPHEN 10 MG/ML IV SOLN: 1000.0000 mg | Freq: Once | INTRAVENOUS | Status: DC | PRN

## 2024-04-27 MED ORDER — KETOROLAC TROMETHAMINE 15 MG/ML IJ SOLN: 15.0000 mg | INTRAMUSCULAR | Status: DC

## 2024-04-27 MED ORDER — CEFAZOLIN SODIUM-DEXTROSE 2-4 GM/100ML-% IV SOLN
2.0000 g | INTRAVENOUS | Status: AC
  Administered 2024-04-27: 2 g via INTRAVENOUS
  Filled 2024-04-27: qty 100

## 2024-04-27 MED ORDER — PROPOFOL 1000 MG/100ML IV EMUL
INTRAVENOUS | Status: AC
  Filled 2024-04-27: qty 100

## 2024-04-27 MED ORDER — SUGAMMADEX SODIUM 200 MG/2ML IV SOLN
INTRAVENOUS | Status: DC | PRN
  Administered 2024-04-27: 100 mg via INTRAVENOUS

## 2024-04-27 MED ORDER — GABAPENTIN 300 MG PO CAPS
300.0000 mg | ORAL_CAPSULE | ORAL | Status: AC
  Administered 2024-04-27: 300 mg via ORAL
  Filled 2024-04-27: qty 1

## 2024-04-27 MED ORDER — LIDOCAINE HCL (CARDIAC) PF 100 MG/5ML IV SOSY
PREFILLED_SYRINGE | INTRAVENOUS | Status: DC | PRN
  Administered 2024-04-27: 60 mg via INTRAVENOUS

## 2024-04-27 MED ORDER — MIDAZOLAM HCL 2 MG/2ML IJ SOLN
INTRAMUSCULAR | Status: AC
  Filled 2024-04-27: qty 2

## 2024-04-27 MED ORDER — SCOPOLAMINE 1 MG/3DAYS TD PT72SCOPOLAMINE 1 MG/3DAYS
1.0000 | MEDICATED_PATCH | Freq: Once | TRANSDERMAL | Status: DC
Start: 2024-04-27 — End: 2024-04-27
  Administered 2024-04-27: 1.5 mg via TRANSDERMAL
  Filled 2024-04-27: qty 1

## 2024-04-27 MED ORDER — SODIUM CHLORIDE 0.9 % IR SOLN
Status: DC | PRN
  Administered 2024-04-27: 1000 mL

## 2024-04-27 MED ORDER — BUPIVACAINE-EPINEPHRINE 0.25% -1:200000 IJ SOLN
INTRAMUSCULAR | Status: DC | PRN
  Administered 2024-04-27: 30 mL

## 2024-04-27 MED ORDER — CHLORHEXIDINE GLUCONATE 0.12 % MT SOLN
15.0000 mL | Freq: Once | OROMUCOSAL | Status: AC
  Administered 2024-04-27: 15 mL via OROMUCOSAL

## 2024-04-27 SURGICAL SUPPLY — 29 items
BAG COUNTER SPONGE SURGICOUNT (BAG) IMPLANT
BENZOIN TINCTURE PRP APPL 2/3 (GAUZE/BANDAGES/DRESSINGS) IMPLANT
CHLORAPREP W/TINT 26 (MISCELLANEOUS) ×1 IMPLANT
COVER SURGICAL LIGHT HANDLE (MISCELLANEOUS) ×1 IMPLANT
DERMABOND ADVANCED .7 DNX12 (GAUZE/BANDAGES/DRESSINGS) IMPLANT
DRAPE LAPAROSCOPIC ABDOMINAL (DRAPES) ×1 IMPLANT
ELECT PENCIL ROCKER SW 15FT (MISCELLANEOUS) IMPLANT
ELECT REM PT RETURN 15FT ADLT (MISCELLANEOUS) ×1 IMPLANT
GAUZE SPONGE 4X4 12PLY STRL (GAUZE/BANDAGES/DRESSINGS) IMPLANT
GLOVE BIO SURGEON STRL SZ7.5 (GLOVE) ×1 IMPLANT
GLOVE INDICATOR 8.0 STRL GRN (GLOVE) ×1 IMPLANT
GOWN STRL REUS W/ TWL XL LVL3 (GOWN DISPOSABLE) ×1 IMPLANT
KIT BASIN OR (CUSTOM PROCEDURE TRAY) ×1 IMPLANT
KIT TURNOVER KIT A (KITS) ×1 IMPLANT
MESH BARD SOFT 3X6IN (Mesh General) IMPLANT
NDL HYPO 22X1.5 SAFETY MO (MISCELLANEOUS) IMPLANT
NEEDLE HYPO 22X1.5 SAFETY MO (MISCELLANEOUS) ×1 IMPLANT
PACK GENERAL/GYN (CUSTOM PROCEDURE TRAY) ×1 IMPLANT
SPIKE FLUID TRANSFER (MISCELLANEOUS) ×1 IMPLANT
STRIP CLOSURE SKIN 1/2X4 (GAUZE/BANDAGES/DRESSINGS) IMPLANT
SUT ETHIBOND 0 MO6 C/R (SUTURE) IMPLANT
SUT MNCRL AB 4-0 PS2 18 (SUTURE) ×1 IMPLANT
SUT PDS AB 0 CT 36 (SUTURE) IMPLANT
SUT PROLENE 2 0 CT2 30 (SUTURE) IMPLANT
SUT VIC AB 2-0 SH 27X BRD (SUTURE) IMPLANT
SUT VIC AB 3-0 SH 27XBRD (SUTURE) IMPLANT
SYR CONTROL 10ML LL (SYRINGE) IMPLANT
TOWEL OR 17X26 10 PK STRL BLUE (TOWEL DISPOSABLE) ×1 IMPLANT
TOWEL ~~LOC~~+RFID 17X26 BLUE (SPONGE) IMPLANT

## 2024-04-27 NOTE — Anesthesia Procedure Notes (Addendum)
 Procedure Name: Intubation Date/Time: 04/27/2024 9:32 AM  Performed by: Paul Lamarr BRAVO, MDPre-anesthesia Checklist: Patient identified, Emergency Drugs available, Suction available and Patient being monitored Patient Re-evaluated:Patient Re-evaluated prior to induction Oxygen Delivery Method: Circle System Utilized Preoxygenation: Pre-oxygenation with 100% oxygen Induction Type: IV induction Ventilation: Mask ventilation without difficulty Laryngoscope Size: Mac and 4 Tube type: Oral Tube size: 7.0 mm Number of attempts: 2 Airway Equipment and Method: Stylet Placement Confirmation: ETT inserted through vocal cords under direct vision, positive ETCO2 and breath sounds checked- equal and bilateral Secured at: 21 cm Tube secured with: Tape Dental Injury: Teeth and Oropharynx as per pre-operative assessment  Comments: First attempt by paramedic student, esophageal intubation, immediately recognized and ETT removed. Second attempt by myself, grade 1 view. Atraumatic intubation. Lawence, MD

## 2024-04-27 NOTE — Transfer of Care (Signed)
 Immediate Anesthesia Transfer of Care Note  Patient: Sherri Salazar  Procedure(s) Performed: REPAIR, HERNIA, EPIGASTRIC, ADULT (Abdomen)  Patient Location: PACU  Anesthesia Type:General  Level of Consciousness: awake, alert , and oriented  Airway & Oxygen Therapy: Patient Spontanous Breathing and Patient connected to face mask oxygen  Post-op Assessment: Report given to RN and Post -op Vital signs reviewed and stable  Post vital signs: Reviewed and stable  Last Vitals:  Vitals Value Taken Time  BP 109/75 04/27/24 10:36  Temp    Pulse 89 04/27/24 10:39  Resp 13 04/27/24 10:39  SpO2 100 % 04/27/24 10:39  Vitals shown include unfiled device data.  Last Pain:  Vitals:   04/27/24 0743  TempSrc:   PainSc: 0-No pain         Complications: No notable events documented.

## 2024-04-27 NOTE — Discharge Instructions (Signed)
 VENTRAL HERNIA REPAIR POST OPERATIVE INSTRUCTIONS  Thinking Clearly  The anesthesia may cause you to feel different for 1 or 2 days. Do not drive, drink alcohol, or make any big decisions for at least 2 days.  Nutrition When you wake up, you will be able to drink small amounts of liquid. If you do not feel sick, you can slowly advance your diet to regular foods. Continue to drink lots of fluids, usually about 8 to 10 glasses per day. Eat a high-fiber diet so you don't strain during bowel movements. High-Fiber Foods Foods high in fiber include beans, bran cereals and whole-grain breads, peas, dried fruit (figs, apricots, and dates), raspberries, blackberries, strawberries, sweet corn, broccoli, baked potatoes with skin, plums, pears, apples, greens, and nuts. Activity Slowly increase your activity. Be sure to get up and walk every hour or so to prevent blood clots. No heavy lifting or strenuous activity for 4 weeks following surgery to prevent hernias at your incision sites or recurrence of your hernia. It is normal to feel tired. You may need more sleep than usual.  Get your rest but make sure to get up and move around frequently to prevent blood clots and pneumonia.  Work and Return to [employer] can go back to work when you feel well enough. Discuss the timing with your surgeon. You can usually go back to school or work 1 week or less after an laparoscopic or an open repair. If your work requires heavy lifting or strenuous activity you need to be placed on light duty for 4 weeks following surgery. You can return to gym class, sports or other physical activities 4 weeks after surgery.  Wound Care You may experience significant bruising throughout the abdominal wall that may track down into the groin including into the scrotum in males.  Rest, elevating the groin and scrotum above the level of the heart, ice and compression with tight fitting underwear or an abdominal binder can help.   Always wash your hands before and after touching near your incision site. Do not soak in a bathtub until cleared at your follow up appointment. You may take a shower 24 hours after surgery. A small amount of drainage from the incision is normal. If the drainage is thick and yellow or the site is red, you may have an infection, so call your surgeon. If you have a drain in one of your incisions, it will be taken out in office when the drainage stops. Steri-Strips will fall off in 7 to 10 days or they will be removed during your first office visit. If you have dermabond glue covering over the incision, allow the glue to flake off on its own. Protect the new skin, especially from the sun. The sun can burn and cause darker scarring. Your scar will heal in about 4 to 6 weeks and will become softer and continue to fade over the next year.  The cosmetic appearance of the incisions will improve over the course of the first year after surgery. Sensation around your incision will return in a few weeks or months.  Bowel Movements After intestinal surgery, you may have loose watery stools for several days. If watery diarrhea lasts longer than 3 days, contact your surgeon. Pain medication (narcotics) can cause constipation. Increase the fiber in your diet with high-fiber foods if you are constipated. You can take an over the counter stool softener like Colace to avoid constipation.  Additional over the counter medications can also be used  if Colace isn't sufficient (for example, Milk of Magnesia or Miralax).  Pain The amount of pain is different for each person. Some people need only 1 to 3 doses of pain control medication, while others need more. Take alternating doses of tylenol and ibuprofen around the clock for the first five days following surgery.  This will provide a baseline of pain control and help with inflammation.  Take the narcotic pain medication in addition if needed for severe pain.  Contact  Your Surgeon at [phone], if you have: Pain that will not go away Pain that gets worse A fever of more than 101F (38.3C) Repeated vomiting Swelling, redness, bleeding, or bad-smelling drainage from your wound site Strong abdominal pain No bowel movement or unable to pass gas for 3 days Watery diarrhea lasting longer than 3 days  Pain Control The goal of pain control is to minimize pain, keep you moving and help you heal. Your surgical team will work with you on your pain plan. Most often a combination of therapies and medications are used to control your pain. You may also be given medication (local anesthetic) at the surgical site. This may help control your pain for several days. Extreme pain puts extra stress on your body at a time when your body needs to focus on healing. Do not wait until your pain has reached a level "10" or is unbearable before telling your doctor or nurse. It is much easier to control pain before it becomes severe. Following a laparoscopic procedure, pain is sometimes felt in the shoulder. This is due to the gas inserted into your abdomen during the procedure. Moving and walking helps to decrease the gas and the right shoulder pain.  Use the guide below for ways to manage your post-operative pain. Learn more by going to facs.org/safepaincontrol.  How Intense Is My Pain Common Therapies to Feel Better       I hardly notice my pain, and it does not interfere with my activities.  I notice my pain and it distracts me, but I can still do activities (sitting up, walking, standing).  Non-Medication Therapies  Ice (in a bag, applied over clothing at the surgical site), elevation, rest, meditation, massage, distraction (music, TV, play) walking and mild exercise Splinting the abdomen with pillows +  Non-Opioid Medications Acetaminophen (Tylenol) Non-steroidal anti-inflammatory drugs (NSAIDS) Aspirin, Ibuprofen (Motrin, Advil) Naproxen (Aleve) Take these as  needed, when you feel pain. Both acetaminophen and NSAIDs help to decrease pain and swelling (inflammation).      My pain is hard to ignore and is more noticeable even when I rest.  My pain interferes with my usual activities.  Non-Medication Therapies  +  Non-Opioid medications  Take on a regular schedule (around-the-clock) instead of as needed. (For example, Tylenol every 6 hours at 9:00 am, 3:00 pm, 9:00 pm, 3:00 am and Motrin every 6 hours at 12:00 am, 6:00 am, 12:00 pm, 6:00 pm)         I am focused on my pain, and I am not doing my daily activities.  I am groaning in pain, and I cannot sleep. I am unable to do anything.  My pain is as bad as it could be, and nothing else matters.  Non-Medication Therapies  +  Around-the-Clock Non-Opioid Medications  +  Short-acting opioids  Opioids should be used with other medications to manage severe pain. Opioids block pain and give a feeling of euphoria (feel high). Addiction, a serious side effect of opioids, is  rare with short-term (a few days) use.  Examples of short-acting opioids include: Tramadol (Ultram), Hydrocodone (Norco, Vicodin), Hydromorphone (Dilaudid), Oxycodone (Oxycontin)     The above directions have been adapted from the [employer] of [employer] Surgical Patient Education Program.  Please refer to the ACS website if needed: http://kaiser.net/   Ivar Drape, MD [facility], [place] [address], [address], [place], [place]  [address] ?  [address] [address], [place], [place]   [address] [phone] ? [phone] ? FAX [phone] Web site: [email or web address]

## 2024-04-27 NOTE — H&P (Signed)
 Admitting Physician: Deward PARAS Stokely Jeancharles  Service: General SUrgery  CC: Hernia  Subjective   HPI: Sherri Salazar is an 58 y.o. female who is here for hernia repair  Past Medical History:  Diagnosis Date   Anxiety    Migraines    PONV (postoperative nausea and vomiting)     Past Surgical History:  Procedure Laterality Date   ABLATION     BIOPSY  09/17/2021   Procedure: BIOPSY;  Surgeon: Cindie Carlin POUR, DO;  Location: [facility];  Service: Endoscopy;;   CESAREAN SECTION  2000,2002   COLONOSCOPY WITH PROPOFOL  N/A 09/17/2021   Procedure: COLONOSCOPY WITH PROPOFOL ;  Surgeon: Cindie Carlin POUR, DO;  Location: [facility];  Service: Endoscopy;  Laterality: N/A;  8:00am   VENTRAL HERNIA REPAIR N/A 03/09/2020   Procedure: VENTRAL HERNIA REPAIR;  Surgeon: Kallie Manuelita BROCKS, MD;  Location: [facility];  Service: General;  Laterality: N/A;    Family History  Problem Relation Age of Onset   Parkinson's disease Father    Colon cancer Neg Hx     Social:  reports that she quit smoking about 38 years ago. Her smoking use included cigarettes. She has never used smokeless tobacco. She reports that she does not currently use alcohol. She reports that she does not use drugs.  Allergies:  Allergies  Allergen Reactions   Lexapro [Escitalopram] Other (See Comments)    6 mths Cried all  the time.    Prozac [Fluoxetine] Other (See Comments)    Suicidal thoughts   Zoloft [Sertraline Hcl] Other (See Comments)    Suicidal thoughts   Wellbutrin [Bupropion] Hives and Rash   Vicodin [Hydrocodone-Acetaminophen ] Other (See Comments)    hallucinate    Medications: Current Outpatient Medications  Medication Instructions   aspirin-acetaminophen -caffeine  (EXCEDRIN MIGRAINE) 250-250-65 MG tablet 2 tablets, 2 times daily PRN   citalopram  (CELEXA ) 10 mg, Oral, Daily   lisdexamfetamine (VYVANSE) 30 mg, Oral, Daily    ROS - all of the below systems have been reviewed with the patient and  positives are indicated with bold text General: chills, fever or night sweats Eyes: blurry vision or double vision ENT: epistaxis or sore throat Allergy/Immunology: itchy/watery eyes or nasal congestion Hematologic/Lymphatic: bleeding problems, blood clots or swollen lymph nodes Endocrine: temperature intolerance or unexpected weight changes Breast: new or changing breast lumps or nipple discharge Resp: cough, shortness of breath, or wheezing CV: chest pain or dyspnea on exertion GI: as per HPI GU: dysuria, trouble voiding, or hematuria MSK: joint pain or joint stiffness Neuro: TIA or stroke symptoms Derm: pruritus and skin lesion changes Psych: anxiety and depression  Objective   PE Blood pressure 113/75, pulse 85, temperature 97.8 F (36.6 C), temperature source Oral, resp. rate 16, SpO2 100%. Constitutional: NAD; conversant; no deformities Eyes: Moist conjunctiva; no lid lag; anicteric; PERRL Neck: Trachea midline; no thyromegaly Lungs: Normal respiratory effort; no tactile fremitus CV: RRR; no palpable thrills; no pitting edema GI: Abd epigastric hernia.  1cm x 1cm defect; no palpable hepatosplenomegaly MSK: Normal range of motion of extremities; no clubbing/cyanosis Psychiatric: Appropriate affect; alert and oriented x3 Lymphatic: No palpable cervical or axillary lymphadenopathy  No results found for this or any previous visit (from the past 24 hours).  Imaging Orders  No imaging studies ordered today     Assessment and Plan   Sherri Salazar is an 58 y.o. female with an epigastric hernia  Recurrent abdominal wall hernia Recurrent abdominal wall hernia at the site of previous hernia  repair performed in 2021, likely due to a small defect in the muscle layer or suture failure. The hernia is now large enough to consider mesh placement to reinforce the area and prevent recurrence. 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 - Instruct on postoperative care: no heavy lifting or strenuous activity for four weeks. - Prescribe Percocet for postoperative pain management, with alternatives of acetaminophen , ibuprofen, and ice packs. - Advise on incision care: glue on incision can get wet in the shower but avoid submersion until healed. - Arrange for same-day discharge with a driver post-surgery.  I recommended open recurrent epigastric hernia repair with mesh. We discussed the procedure itself as well as its risk, benefits, and alternatives. After full discussion all questions answered the patient. Consent to proceed.  Hernia measures 1 cm x 1 cm based on exam.    Deward JINNY Foy, MD  [facility] Use [email or web address] to contact on call provider

## 2024-04-27 NOTE — Op Note (Signed)
   Patient: Sherri Salazar ([birth year], [id number])  Date of Surgery: 04/27/2024  Preoperative Diagnosis: RECURRENT EPIGASTRIC HERNIA   Postoperative Diagnosis: RECURRENT EPIGASTRIC HERNIA   Surgical Procedure: REPAIR, HERNIA, EPIGASTRIC, ADULT: 50386 (CPT)    Operative Team Members:  Surgeons and Role:    * Jessieca Rhem, Deward PARAS, MD - Primary   Anesthesiologist: Eisa Necaise Lamarr BRAVO, MD CRNA: Landy Chip HERO, CRNA   Anesthesia: General   Fluids:  Total I/O In: 200 [IV Piggyback:200] Out: -   Complications: None  Drains:  none   Specimen: * No specimens in log *   Disposition:  PACU - hemodynamically stable.  Plan of Care: Discharge to home after PACU    Indications for Procedure: [name] is a 58 y.o. female who presented with a recurrent epigastric hernia measuring 1cm x 1cm on exam.  I recommended open recurrent epigastric hernia repair with mesh.  We discussed the procedure, its risks, benefits and alternatives and the patient granted consent to proceed.  Findings:  Hernia Location: epigastric Hernia Size:  1 x 1 cm  Mesh Size &Type:  7.5 cm x 10 cm Bard Soft Mesh Position: Preperitoneal  Description of Procedure:  The patient was positioned supine, padded and secured to the bed.  The abdomen was widely prepped and draped.  A time out procedure was performed.    An incision was made over the previous epigastric hernia repair incision and dissection was carried down through the subcutaneous tissue to the level of the fascia.  The hernia defect was measured as 1 cm wide by 1 cm in vertical dimension.    An epigastric hernia repair with mesh was performed.  The hernia sac was scored along the perimeter of the fascial defect, entering the pre peritoneal plane.  A wide pre peritoneal dissection was performed creating a space for mesh placement.  A piece of Bard Soft was opened and trimmed to 1 cm x 1 cm and deployed into the pre peritoneal space.  The mesh  laid in taut position in the pre peritoneal plane and did not require fixation.  The fascial defect was closed horizontally, overtop the mesh with running 0 PDS suture.  The skin was closed with 4-0 Monocryl subcuticular suture and skin glue.    Deward Foy, MD General, Bariatric, & Minimally Invasive Surgery [facility], [place]

## 2024-04-27 NOTE — Anesthesia Postprocedure Evaluation (Signed)
 Anesthesia Post Note  Patient: Sherri Salazar  Procedure(s) Performed: REPAIR, HERNIA, EPIGASTRIC, ADULT (Abdomen)     Patient location during evaluation: PACU Anesthesia Type: General Level of consciousness: awake and alert Pain management: pain level controlled Vital Signs Assessment: post-procedure vital signs reviewed and stable Respiratory status: spontaneous breathing, nonlabored ventilation and respiratory function stable Cardiovascular status: blood pressure returned to baseline Postop Assessment: no apparent nausea or vomiting Anesthetic complications: no   No notable events documented.  Last Vitals:  Vitals:   04/27/24 1112 04/27/24 1115  BP: 108/70   Pulse: 73 71  Resp: 16   Temp: 36.4 C   SpO2: 99% 97%    Last Pain:  Vitals:   04/27/24 1112  TempSrc: Oral  PainSc: 0-No pain                 Vertell Row

## 2024-04-28 ENCOUNTER — Encounter (HOSPITAL_COMMUNITY): Payer: Self-pay | Admitting: Surgery

## 2024-05-02 ENCOUNTER — Other Ambulatory Visit (HOSPITAL_COMMUNITY): Payer: Self-pay | Admitting: Adult Health

## 2024-05-02 DIAGNOSIS — Z1231 Encounter for screening mammogram for malignant neoplasm of breast: Secondary | ICD-10-CM

## 2024-06-20 ENCOUNTER — Ambulatory Visit (HOSPITAL_COMMUNITY)
Admission: RE | Admit: 2024-06-20 | Discharge: 2024-06-20 | Disposition: A | Discharge status: Home / Self Care | Source: Ambulatory Visit | Attending: Adult Health | Admitting: Adult Health

## 2024-06-20 ENCOUNTER — Encounter (HOSPITAL_COMMUNITY): Payer: Self-pay

## 2024-06-20 DIAGNOSIS — Z1231 Encounter for screening mammogram for malignant neoplasm of breast: Secondary | ICD-10-CM | POA: Insufficient documentation

## 2024-06-22 ENCOUNTER — Ambulatory Visit: Payer: Self-pay | Admitting: Adult Health

## 2024-06-22 ENCOUNTER — Other Ambulatory Visit (HOSPITAL_COMMUNITY): Payer: Self-pay | Admitting: Adult Health

## 2024-06-22 DIAGNOSIS — R928 Other abnormal and inconclusive findings on diagnostic imaging of breast: Secondary | ICD-10-CM

## 2024-07-12 ENCOUNTER — Ambulatory Visit (HOSPITAL_COMMUNITY)
Admission: RE | Admit: 2024-07-12 | Discharge: 2024-07-12 | Disposition: A | Source: Ambulatory Visit | Attending: Adult Health | Admitting: Adult Health

## 2024-07-12 ENCOUNTER — Ambulatory Visit (HOSPITAL_COMMUNITY)
Admission: RE | Admit: 2024-07-12 | Discharge: 2024-07-12 | Disposition: A | Discharge status: Home / Self Care | Source: Ambulatory Visit | Attending: Adult Health | Admitting: Adult Health

## 2024-07-12 DIAGNOSIS — R928 Other abnormal and inconclusive findings on diagnostic imaging of breast: Secondary | ICD-10-CM | POA: Insufficient documentation

## 2024-07-13 ENCOUNTER — Other Ambulatory Visit (HOSPITAL_COMMUNITY): Payer: Self-pay | Admitting: Adult Health

## 2024-07-13 ENCOUNTER — Ambulatory Visit: Payer: Self-pay | Admitting: Adult Health

## 2024-07-13 DIAGNOSIS — R928 Other abnormal and inconclusive findings on diagnostic imaging of breast: Secondary | ICD-10-CM

## 2024-07-18 ENCOUNTER — Other Ambulatory Visit (HOSPITAL_COMMUNITY): Payer: Self-pay | Admitting: Adult Health

## 2024-07-18 DIAGNOSIS — R928 Other abnormal and inconclusive findings on diagnostic imaging of breast: Secondary | ICD-10-CM

## 2024-07-19 ENCOUNTER — Ambulatory Visit (HOSPITAL_COMMUNITY)
Admission: RE | Admit: 2024-07-19 | Discharge: 2024-07-19 | Disposition: A | Discharge status: Home / Self Care | Source: Ambulatory Visit | Attending: Adult Health | Admitting: Adult Health

## 2024-07-19 ENCOUNTER — Encounter (HOSPITAL_COMMUNITY): Payer: Self-pay

## 2024-07-19 DIAGNOSIS — R928 Other abnormal and inconclusive findings on diagnostic imaging of breast: Secondary | ICD-10-CM | POA: Diagnosis present

## 2024-07-19 HISTORY — PX: BREAST BIOPSY: SHX20

## 2024-07-19 MED ORDER — LIDOCAINE HCL (PF) 2 % IJ SOLN
10.0000 mL | Freq: Once | INTRAMUSCULAR | Status: AC
  Administered 2024-07-19: 10 mL

## 2024-07-19 MED ORDER — LIDOCAINE-EPINEPHRINE (PF) 1 %-1:200000 IJ SOLN
10.0000 mL | Freq: Once | INTRAMUSCULAR | Status: AC
  Administered 2024-07-19: 10 mL via INTRADERMAL

## 2024-07-19 MED ORDER — LIDOCAINE HCL (PF) 2 % IJ SOLN
INTRAMUSCULAR | Status: AC
  Filled 2024-07-19: qty 10

## 2024-07-19 NOTE — Progress Notes (Signed)
 PT tolerated right breast biopsy well today with NAD noted. PT verbalized understanding of discharge instructions. PT ambulated back to the mammogram area this time and given ice packs to use. Specimens taken to lab for processing by Colette from ultrasound.

## 2024-07-20 ENCOUNTER — Ambulatory Visit: Payer: Self-pay | Admitting: Adult Health

## 2024-07-20 LAB — SURGICAL PATHOLOGY
